# Patient Record
Sex: Female | Born: 1990 | Race: White | Hispanic: No | Marital: Married | Smoking: Former smoker
Health system: Southern US, Community
[De-identification: ages and names within clinical notes are randomized; demographics above are authoritative.]

## PROBLEM LIST (undated history)

## (undated) ENCOUNTER — Inpatient Hospital Stay: Payer: Self-pay

## (undated) DIAGNOSIS — R519 Headache, unspecified: Secondary | ICD-10-CM

## (undated) DIAGNOSIS — G5603 Carpal tunnel syndrome, bilateral upper limbs: Secondary | ICD-10-CM

## (undated) DIAGNOSIS — E781 Pure hyperglyceridemia: Secondary | ICD-10-CM

## (undated) DIAGNOSIS — G43909 Migraine, unspecified, not intractable, without status migrainosus: Secondary | ICD-10-CM

## (undated) DIAGNOSIS — D649 Anemia, unspecified: Secondary | ICD-10-CM

## (undated) DIAGNOSIS — J189 Pneumonia, unspecified organism: Secondary | ICD-10-CM

## (undated) DIAGNOSIS — G932 Benign intracranial hypertension: Secondary | ICD-10-CM

## (undated) DIAGNOSIS — K219 Gastro-esophageal reflux disease without esophagitis: Secondary | ICD-10-CM

## (undated) DIAGNOSIS — O139 Gestational [pregnancy-induced] hypertension without significant proteinuria, unspecified trimester: Secondary | ICD-10-CM

## (undated) DIAGNOSIS — J45909 Unspecified asthma, uncomplicated: Secondary | ICD-10-CM

## (undated) DIAGNOSIS — F4310 Post-traumatic stress disorder, unspecified: Secondary | ICD-10-CM

## (undated) DIAGNOSIS — S83106A Unspecified dislocation of unspecified knee, initial encounter: Secondary | ICD-10-CM

## (undated) DIAGNOSIS — R7303 Prediabetes: Secondary | ICD-10-CM

## (undated) DIAGNOSIS — O24419 Gestational diabetes mellitus in pregnancy, unspecified control: Secondary | ICD-10-CM

## (undated) DIAGNOSIS — M62830 Muscle spasm of back: Secondary | ICD-10-CM

## (undated) DIAGNOSIS — T753XXA Motion sickness, initial encounter: Secondary | ICD-10-CM

## (undated) DIAGNOSIS — Z8489 Family history of other specified conditions: Secondary | ICD-10-CM

## (undated) DIAGNOSIS — Z973 Presence of spectacles and contact lenses: Secondary | ICD-10-CM

## (undated) DIAGNOSIS — Z87442 Personal history of urinary calculi: Secondary | ICD-10-CM

## (undated) DIAGNOSIS — Z9889 Other specified postprocedural states: Secondary | ICD-10-CM

## (undated) DIAGNOSIS — R112 Nausea with vomiting, unspecified: Secondary | ICD-10-CM

## (undated) DIAGNOSIS — R51 Headache: Secondary | ICD-10-CM

## (undated) HISTORY — PX: TUBAL LIGATION: SHX77

## (undated) HISTORY — PX: UTERINE FIBROID EMBOLIZATION: SHX825

## (undated) HISTORY — PX: APPENDECTOMY: SHX54

## (undated) HISTORY — PX: ABDOMINAL HYSTERECTOMY: SHX81

---
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
## 2012-01-29 ENCOUNTER — Ambulatory Visit: Payer: Self-pay

## 2012-06-10 ENCOUNTER — Emergency Department: Payer: Self-pay | Admitting: Emergency Medicine

## 2013-10-01 ENCOUNTER — Emergency Department: Payer: Self-pay | Admitting: Emergency Medicine

## 2013-10-02 LAB — URINALYSIS, COMPLETE
BLOOD: NEGATIVE
Bilirubin,UR: NEGATIVE
GLUCOSE, UR: NEGATIVE mg/dL (ref 0–75)
Ketone: NEGATIVE
LEUKOCYTE ESTERASE: NEGATIVE
Nitrite: NEGATIVE
PH: 5 (ref 4.5–8.0)
RBC,UR: 4 /HPF (ref 0–5)
Specific Gravity: 1.029 (ref 1.003–1.030)
WBC UR: 6 /HPF (ref 0–5)

## 2013-10-02 LAB — COMPREHENSIVE METABOLIC PANEL
ALBUMIN: 4 g/dL (ref 3.4–5.0)
ALK PHOS: 78 U/L
ALT: 58 U/L (ref 12–78)
ANION GAP: 7 (ref 7–16)
BUN: 13 mg/dL (ref 7–18)
Bilirubin,Total: 0.3 mg/dL (ref 0.2–1.0)
CALCIUM: 9.4 mg/dL (ref 8.5–10.1)
CHLORIDE: 103 mmol/L (ref 98–107)
CO2: 26 mmol/L (ref 21–32)
Creatinine: 0.6 mg/dL (ref 0.60–1.30)
EGFR (African American): >60
EGFR (Non-African Amer.): >60
Glucose: 100 mg/dL — ABNORMAL HIGH (ref 65–99)
Osmolality: 272 (ref 275–301)
Potassium: 3.7 mmol/L (ref 3.5–5.1)
SGOT(AST): 29 U/L (ref 15–37)
SODIUM: 136 mmol/L (ref 136–145)
TOTAL PROTEIN: 8.7 g/dL — AB (ref 6.4–8.2)

## 2013-10-02 LAB — CBC WITH DIFFERENTIAL/PLATELET
Basophil #: 0.1 10*3/uL (ref 0.0–0.1)
Basophil %: 0.5 %
EOS PCT: 3.2 %
Eosinophil #: 0.4 10*3/uL (ref 0.0–0.7)
HCT: 39.9 % (ref 35.0–47.0)
HGB: 13.5 g/dL (ref 12.0–16.0)
LYMPHS ABS: 3.8 10*3/uL — AB (ref 1.0–3.6)
LYMPHS PCT: 30.2 %
MCH: 27.6 pg (ref 26.0–34.0)
MCHC: 33.7 g/dL (ref 32.0–36.0)
MCV: 82 fL (ref 80–100)
MONOS PCT: 6.1 %
Monocyte #: 0.8 x10 3/mm (ref 0.2–0.9)
NEUTROS PCT: 60 %
Neutrophil #: 7.4 10*3/uL — ABNORMAL HIGH (ref 1.4–6.5)
Platelet: 271 10*3/uL (ref 150–440)
RBC: 4.88 10*6/uL (ref 3.80–5.20)
RDW: 13.1 % (ref 11.5–14.5)
WBC: 12.4 10*3/uL — ABNORMAL HIGH (ref 3.6–11.0)

## 2013-10-02 LAB — PREGNANCY, URINE: PREGNANCY TEST, URINE: NEGATIVE m[IU]/mL

## 2013-10-02 LAB — LIPASE, BLOOD: Lipase: 98 U/L (ref 73–393)

## 2014-01-06 ENCOUNTER — Ambulatory Visit: Payer: Self-pay | Admitting: Advanced Practice Midwife

## 2014-01-10 ENCOUNTER — Encounter: Payer: Self-pay | Admitting: Maternal & Fetal Medicine

## 2014-01-17 ENCOUNTER — Encounter: Payer: Self-pay | Admitting: Obstetrics & Gynecology

## 2014-02-07 ENCOUNTER — Encounter: Payer: Self-pay | Admitting: Maternal & Fetal Medicine

## 2014-02-07 LAB — T4, FREE: Free Thyroxine: 1.01 ng/dL (ref 0.76–1.46)

## 2014-02-07 LAB — TSH: Thyroid Stimulating Horm: 0.347 u[IU]/mL — ABNORMAL LOW

## 2014-05-03 ENCOUNTER — Ambulatory Visit: Payer: Self-pay | Admitting: Certified Nurse Midwife

## 2014-05-19 ENCOUNTER — Observation Stay: Payer: Self-pay | Admitting: Obstetrics and Gynecology

## 2014-05-24 ENCOUNTER — Ambulatory Visit: Payer: Self-pay | Admitting: Certified Nurse Midwife

## 2014-06-06 ENCOUNTER — Inpatient Hospital Stay: Payer: Self-pay

## 2014-06-06 LAB — PIH PROFILE
AST: 15 U/L (ref 15–37)
Anion Gap: 10 (ref 7–16)
BUN: 9 mg/dL (ref 7–18)
CO2: 20 mmol/L — AB (ref 21–32)
Calcium, Total: 8.2 mg/dL — ABNORMAL LOW (ref 8.5–10.1)
Chloride: 108 mmol/L — ABNORMAL HIGH (ref 98–107)
Creatinine: 0.57 mg/dL — ABNORMAL LOW (ref 0.60–1.30)
EGFR (African American): >60
EGFR (Non-African Amer.): >60
Glucose: 83 mg/dL (ref 65–99)
HCT: 34.1 % — ABNORMAL LOW (ref 35.0–47.0)
HGB: 11.1 g/dL — AB (ref 12.0–16.0)
MCH: 27.1 pg (ref 26.0–34.0)
MCHC: 32.4 g/dL (ref 32.0–36.0)
MCV: 84 fL (ref 80–100)
OSMOLALITY: 274 (ref 275–301)
Platelet: 170 10*3/uL (ref 150–440)
Potassium: 3.8 mmol/L (ref 3.5–5.1)
RBC: 4.08 10*6/uL (ref 3.80–5.20)
RDW: 14.7 % — ABNORMAL HIGH (ref 11.5–14.5)
Sodium: 138 mmol/L (ref 136–145)
Uric Acid: 5.9 mg/dL (ref 2.6–6.0)
WBC: 8.7 10*3/uL (ref 3.6–11.0)

## 2014-06-06 LAB — PROTEIN / CREATININE RATIO, URINE
CREATININE, URINE: 73.6 mg/dL (ref 30.0–125.0)
Protein, Random Urine: 34 mg/dL — ABNORMAL HIGH (ref 0–12)
Protein/Creat. Ratio: 462 mg/gCREAT — ABNORMAL HIGH (ref 0–200)

## 2014-06-06 LAB — CBC WITH DIFFERENTIAL/PLATELET
BASOS ABS: 0 10*3/uL (ref 0.0–0.1)
Basophil %: 0.3 %
Eosinophil #: 0.1 10*3/uL (ref 0.0–0.7)
Eosinophil %: 0.7 %
HCT: 34 % — AB (ref 35.0–47.0)
HGB: 11 g/dL — AB (ref 12.0–16.0)
Lymphocyte #: 1.5 10*3/uL (ref 1.0–3.6)
Lymphocyte %: 16.6 %
MCH: 27.2 pg (ref 26.0–34.0)
MCHC: 32.5 g/dL (ref 32.0–36.0)
MCV: 84 fL (ref 80–100)
Monocyte #: 0.8 x10 3/mm (ref 0.2–0.9)
Monocyte %: 9.1 %
NEUTROS ABS: 6.4 10*3/uL (ref 1.4–6.5)
Neutrophil %: 73.3 %
Platelet: 172 10*3/uL (ref 150–440)
RBC: 4.06 10*6/uL (ref 3.80–5.20)
RDW: 14.9 % — ABNORMAL HIGH (ref 11.5–14.5)
WBC: 8.8 10*3/uL (ref 3.6–11.0)

## 2014-06-06 LAB — LACTATE DEHYDROGENASE: LDH: 164 U/L (ref 81–246)

## 2014-06-09 LAB — HEMATOCRIT: HCT: 34.9 % — AB (ref 35.0–47.0)

## 2015-01-14 NOTE — Consult Note (Signed)
Referral Information:  Reason for Referral 24 yo G2P0010 at 16/4 weeks by ultrasound performed on 01/06/14 (measurements c/w 16 weeks) referred for consultation due to mildly decreased TSH, normal free T4, obesity, and history of tobacco use.   Referring Physician [facility] Department   Past Obstetrical Hx 11/12 4 weeks, [place]   Home Medications: Medication Instructions Status  Prenatal Multivitamins Prenatal Multivitamins oral tablet 1 tab(s) orally once a day Active   Allergies:   Penicillin: Anaphylaxis  Shellfish: Itching  Vital Signs/Notes:  Nursing Vital Signs: **Vital Signs.:   20-Apr-15 08:28  Vital Signs Type Routine  Temperature Temperature (F) 98  Celsius 36.6  Temperature Source oral  Pulse Pulse 88  Respirations Respirations 18  Systolic BP Systolic BP 563  Diastolic BP (mmHg) Diastolic BP (mmHg) 69  Mean BP 85  Pulse Ox % Pulse Ox % 97  Pulse Ox Activity Level  At rest  Oxygen Delivery Room Air/ 21 %   Perinatal Consult:  PMed Hx Hx of varicella   PSurg Hx Appendectomy, age 76   FHx father with cardiomegaly, HTN, no birth defects   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  She has no palpable thyromegaly or nodules.  We addressed the mild decrease in TSH but normal hormone levels. These results may reflect first trimester pregnancy changes.  I recommended repeat full thyroid function testing in one month.   2. BMI 36--we addressed weight gain goals of ~15 lbs during pregnancy.  She reports weight loss during pregnancy and reporst  nausea but no emesis.  She also reports a new aversion to meat but does consume other forms of protein.  We addressed risks for conditions such as preeclampsia.   3. Tobacco use--we addressed risks associated with tobacco use such as IUGR and abruption.   Recommendations 1. Recomemnd repeat full thryoid profile in ~one month 2. Detailed anatomic survey and follwo up growth at ~28-32 weeks.  She plans to resume care with [facility].   Please schedule ultrasound where convenient. 3. She may benefit from nutrition counseling   Plan:  Prenatal Diagnosis Options Level II [place]   Additional Testing Thyroid panel, Folate/prenatal vitamins    Total Time Spent with Patient 30 minutes   >50% of visit spent in couseling/coordination of care yes   Office Use Only [address]9214  Office Visit Level 4 (79min) EST detailed office/outpt   Coding Description: MATERNAL CONDITIONS/HISTORY INDICATION(S).   Obesity - BMI greater than equal to 30.  Electronic Signatures: Manfred Shirts (MD)  (Signed 20-Apr-15 10:57)  Authored: Referral, Home Medications, Allergies, Vital Signs/Notes, Consult, Exam, Lab/Radiology Notes, Impression, Plan, Billing, Coding Description   Last Updated: 20-Apr-15 10:57 by Manfred Shirts (MD)

## 2015-01-31 NOTE — H&P (Signed)
L&D Evaluation:  History:  HPI -CC: q5-46m UCs -HPI: 24 y/o G2P0010 @ 35/0 (16wk u/s), with the above CC. Preg c/b BMI 40, GDMA1, anxiety/depression (on zoloft), h/o tobacco abuse.  pains started this morning  and increased to about q5-65m so she came in for evaluation. no vb, lof or decreased fm.  seen on 8/25 for HROB and was doing well and continued to be diet controlled.   Patient's Surgical History Appendectomy   Medications Pre Natal Vitamins  zoloft   Exam:  Vital Signs AF VS normal and stable   General no apparent distress   Mental Status clear   Chest other  CTAB   Heart RRR, no MRGs   Abdomen gravid, nttp   Estimated Fetal Weight Large for gestational age   Pelvic per rn at cl (?FT)/long/high at approx 1630   Mebranes Intact   FHT 135 baseline +accels, no decels, mod var   Ucx q2-36m   Impression:  Impression eval of labor; pt stable   Plan:  Comments *IUP: category I tracing, fetal status reassuring *EOL: recheck in 1.5hrs and if unchanged, d/c to home with precautions *GDMA1: no issues *GBS unknown: no current RFs *BMI 40: no current issues   Electronic Signatures: Aletha Halim (MD)  (Signed 27-Aug-15 19:08)  Authored: L&D Evaluation   Last Updated: 27-Aug-15 19:08 by Aletha Halim (MD)

## 2015-03-11 ENCOUNTER — Emergency Department
Admission: EM | Admit: 2015-03-11 | Discharge: 2015-03-11 | Disposition: A | Discharge status: Home / Self Care | Payer: Self-pay | Attending: Emergency Medicine | Admitting: Emergency Medicine

## 2015-03-11 ENCOUNTER — Encounter: Payer: Self-pay | Admitting: Emergency Medicine

## 2015-03-11 DIAGNOSIS — Z880 Allergy status to penicillin: Secondary | ICD-10-CM | POA: Insufficient documentation

## 2015-03-11 DIAGNOSIS — J020 Streptococcal pharyngitis: Secondary | ICD-10-CM | POA: Insufficient documentation

## 2015-03-11 DIAGNOSIS — Z79899 Other long term (current) drug therapy: Secondary | ICD-10-CM | POA: Insufficient documentation

## 2015-03-11 DIAGNOSIS — Z720 Tobacco use: Secondary | ICD-10-CM | POA: Insufficient documentation

## 2015-03-11 MED ORDER — AZITHROMYCIN 250 MG PO TABS
500.0000 mg | ORAL_TABLET | Freq: Once | ORAL | Status: AC
  Administered 2015-03-11: 500 mg via ORAL

## 2015-03-11 MED ORDER — ACETAMINOPHEN 325 MG PO TABS
650.0000 mg | ORAL_TABLET | Freq: Four times a day (QID) | ORAL | Status: DC | PRN
  Administered 2015-03-11: 650 mg via ORAL

## 2015-03-11 MED ORDER — ACETAMINOPHEN 325 MG PO TABS
ORAL_TABLET | ORAL | Status: AC
  Filled 2015-03-11: qty 2

## 2015-03-11 MED ORDER — AZITHROMYCIN 500 MG PO TABS: 500.0000 mg | ORAL_TABLET | Freq: Every day | ORAL | Status: AC

## 2015-03-11 MED ORDER — AZITHROMYCIN 250 MG PO TABS
ORAL_TABLET | ORAL | Status: AC
  Administered 2015-03-11: 500 mg via ORAL
  Filled 2015-03-11: qty 2

## 2015-03-11 NOTE — ED Provider Notes (Signed)
[facility] Emergency Department Provider Note  ____________________________________________  Time seen: Approximately 3:20 AM  I have reviewed the triage vital signs and the nursing notes.   HISTORY  Chief Complaint Headache; Fever; and Sore Throat    HPI Julie Estrada is a 23 y.o. female with a history of enlarged tonsils who presents today with 3 days of fever and worsening sore throat. Has nausea but no vomiting. Reports headache. No known sick contacts. Says this was to have tonsils out when 24 years old with did not have follow up. Denies cough.   No past medical history on file.  There are no active problems to display for this patient.   No past surgical history on file.  Current Outpatient Rx  Name  Route  Sig  Dispense  Refill  . Ephedrine-Guaifenesin (BRONKAID PO)   Oral   Take 1 tablet by mouth once.         . Pseudoeph-CPM-DM-APAP (TYLENOL COLD PO)   Oral   Take 1 Dose by mouth every 4 (four) hours as needed (fever/ congestion).         . Pseudoephedrine-Naproxen Na (ALEVE-D SINUS & COLD) 120-220 MG TB12   Oral   Take 1 tablet by mouth once.         . sertraline (ZOLOFT) 50 MG tablet   Oral   Take 50 mg by mouth daily.           Allergies Iodine and Penicillins  No family history on file.  Social History History  Substance Use Topics  . Smoking status: Current Some Day Smoker  . Smokeless tobacco: Never Used  . Alcohol Use: Yes    Review of Systems Constitutional: No fever/chills Eyes: No visual changes. ENT: Plenty of sore throat.  Cardiovascular: Denies chest pain. Respiratory: Denies shortness of breath. Gastrointestinal: No abdominal pain.  No nausea, no vomiting.  No diarrhea.  No constipation. Genitourinary: Negative for dysuria. Musculoskeletal: Negative for back pain. Skin: Negative for rash. Neurological: Negative for headaches, focal weakness or numbness.  10-point ROS otherwise  negative.  ____________________________________________   PHYSICAL EXAM:  VITAL SIGNS: ED Triage Vitals  Enc Vitals Group     BP 03/11/15 0138 135/87 mmHg     Pulse Rate 03/11/15 0138 127     Resp 03/11/15 0138 18     Temp 03/11/15 0138 101.8 F (38.8 C)     Temp Source 03/11/15 0138 Oral     SpO2 03/11/15 0138 97 %     Weight 03/11/15 0138 220 lb (99.791 kg)     Height 03/11/15 0138 5\' 3"  (1.6 m)     Head Cir --      Peak Flow --      Pain Score 03/11/15 0139 10     Pain Loc --      Pain Edu? --      Excl. in [place]? --     Constitutional: Alert and oriented. Well appearing and in no acute distress. Eyes: Conjunctivae are normal. PERRL. EOMI. Head: Atraumatic. Nose: No congestion/rhinnorhea. Mouth/Throat: Mucous membranes are moist.  Beefy-red pharynx with swollen tonsils with exudate. Tonsils are symmetrically swollen and are not touching. Neck: No stridor.  Tender swollen anterior lymphadenopathy. Cardiovascular: Normal rate, regular rhythm. Grossly normal heart sounds.  Good peripheral circulation. Respiratory: Normal respiratory effort.  No retractions. Lungs CTAB. Controlling secretions and with normal voice. Gastrointestinal: Soft and nontender. No distention. No abdominal bruits. No CVA tenderness. Musculoskeletal: No lower extremity tenderness  nor edema.  No joint effusions. Neurologic:  Normal speech and language. No gross focal neurologic deficits are appreciated. Speech is normal. No gait instability. Skin:  Skin is warm, dry and intact. No rash noted. Psychiatric: Mood and affect are normal. Speech and behavior are normal.  ____________________________________________   LABS (all labs ordered are listed, but only abnormal results are displayed)  Labs Reviewed - No data to  display ____________________________________________  EKG   ____________________________________________  RADIOLOGY   ____________________________________________   PROCEDURES    ____________________________________________   INITIAL IMPRESSION / ASSESSMENT AND PLAN / ED COURSE  Pertinent labs & imaging results that were available during my care of the patient were reviewed by me and considered in my medical decision making (see chart for details).  ----------------------------------------- 3:23 AM on 03/11/2015 -----------------------------------------  Weight 102 at this time with temperature of 98.4. We'll treat patient for strep pharyngitis. Centor score of 4.  Penicillin allergy we'll treat with azithromycin. Patient will continue with Tylenol and ibuprofen at home for fever and pain.  ____________________________________________   FINAL CLINICAL IMPRESSION(S) / ED DIAGNOSES  Acute strep pharyngitis. Initial visit.    Orbie Pyo, MD 03/11/15 [phone]

## 2015-03-11 NOTE — Discharge Instructions (Signed)
Pharyngitis Pharyngitis is a sore throat (pharynx). There is redness, pain, and swelling of your throat. HOME CARE   Drink enough fluids to keep your pee (urine) clear or pale yellow.  Only take medicine as told by your doctor.  You may get sick again if you do not take medicine as told. Finish your medicines, even if you start to feel better.  Do not take aspirin.  Rest.  Rinse your mouth (gargle) with salt water ( tsp of salt per 1 qt of water) every 1-2 hours. This will help the pain.  If you are not at risk for choking, you can suck on hard candy or sore throat lozenges. GET HELP IF:  You have large, tender lumps on your neck.  You have a rash.  You cough up green, yellow-brown, or bloody spit. GET HELP RIGHT AWAY IF:   You have a stiff neck.  You drool or cannot swallow liquids.  You throw up (vomit) or are not able to keep medicine or liquids down.  You have very bad pain that does not go away with medicine.  You have problems breathing (not from a stuffy nose). MAKE SURE YOU:   Understand these instructions.  Will watch your condition.  Will get help right away if you are not doing well or get worse. Document Released: 02/26/2008 Document Revised: 06/30/2013 Document Reviewed: 05/17/2013 [facility] Patient Information 2015 [place], [place]. This information is not intended to replace advice given to you by your health care provider. Make sure you discuss any questions you have with your health care provider.  Strep Throat Strep throat is an infection of the throat. It is caused by a germ. Strep throat spreads from person to person by coughing, sneezing, or close contact. HOME CARE  Rinse your mouth (gargle) with warm salt water (1 teaspoon salt in 1 cup of water). Do this 3 to 4 times per day or as needed for comfort.  Family members with a sore throat or fever should see a doctor.  Make sure everyone in your house washes their hands well.  Do not share  food, drinking cups, or personal items.  Eat soft foods until your sore throat gets better.  Drink enough water and fluids to keep your pee (urine) clear or pale yellow.  Rest.  Stay home from school, daycare, or work until you have taken medicine for 24 hours.  Only take medicine as told by your doctor.  Take your medicine as told. Finish it even if you start to feel better. GET HELP RIGHT AWAY IF:   You have new problems, such as throwing up (vomiting) or bad headaches.  You have a stiff or painful neck, chest pain, trouble breathing, or trouble swallowing.  You have very bad throat pain, drooling, or changes in your voice.  Your neck puffs up (swells) or gets red and tender.  You have a fever.  You are very tired, your mouth is dry, or you are peeing less than normal.  You cannot wake up completely.  You get a rash, cough, or earache.  You have green, yellow-brown, or bloody spit.  Your pain does not get better with medicine. MAKE SURE YOU:   Understand these instructions.  Will watch your condition.  Will get help right away if you are not doing well or get worse. Document Released: 02/26/2008 Document Revised: 12/02/2011 Document Reviewed: 11/08/2010 [facility] Patient Information 2015 [place], [place]. This information is not intended to replace advice given to you by  your health care provider. Make sure you discuss any questions you have with your health care provider. ° °

## 2015-03-11 NOTE — ED Notes (Addendum)
Pt reports headache, sore throat and fever since Thursday afternoon.  Pt reports taking tylenol cold, aleve D, bronchaid w/o relief.  Pt tachycardic in triage.  Pt NAD at this time.    POC Strep positive in triage

## 2015-03-11 NOTE — ED Notes (Addendum)
Positive Rapid Strep throat culture

## 2015-03-15 LAB — POCT RAPID STREP A: STREPTOCOCCUS, GROUP A SCREEN (DIRECT): POSITIVE — AB

## 2015-10-09 ENCOUNTER — Ambulatory Visit (INDEPENDENT_AMBULATORY_CARE_PROVIDER_SITE_OTHER): Payer: BLUE CROSS/BLUE SHIELD | Admitting: Licensed Clinical Social Worker

## 2015-10-09 DIAGNOSIS — F39 Unspecified mood [affective] disorder: Secondary | ICD-10-CM
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
## 2015-10-27 DIAGNOSIS — F411 Generalized anxiety disorder: Secondary | ICD-10-CM | POA: Insufficient documentation

## 2015-10-27 DIAGNOSIS — F4310 Post-traumatic stress disorder, unspecified: Secondary | ICD-10-CM | POA: Insufficient documentation

## 2015-11-22 DIAGNOSIS — M67431 Ganglion, right wrist: Secondary | ICD-10-CM

## 2015-11-22 HISTORY — DX: Ganglion, right wrist: M67.431

## 2015-11-27 ENCOUNTER — Encounter: Payer: Self-pay | Admitting: *Deleted

## 2015-12-06 ENCOUNTER — Ambulatory Visit: Payer: BLUE CROSS/BLUE SHIELD | Admitting: Anesthesiology

## 2015-12-06 ENCOUNTER — Encounter
Admission: RE | Disposition: A | Discharge status: Home / Self Care | Payer: Self-pay | Source: Ambulatory Visit | Attending: Surgery

## 2015-12-06 ENCOUNTER — Ambulatory Visit
Admission: RE | Admit: 2015-12-06 | Discharge: 2015-12-06 | Disposition: A | Discharge status: Home / Self Care | Payer: BLUE CROSS/BLUE SHIELD | Source: Ambulatory Visit | Attending: Surgery | Admitting: Surgery

## 2015-12-06 DIAGNOSIS — Z791 Long term (current) use of non-steroidal anti-inflammatories (NSAID): Secondary | ICD-10-CM | POA: Diagnosis not present

## 2015-12-06 DIAGNOSIS — Z833 Family history of diabetes mellitus: Secondary | ICD-10-CM | POA: Diagnosis not present

## 2015-12-06 DIAGNOSIS — F411 Generalized anxiety disorder: Secondary | ICD-10-CM | POA: Insufficient documentation

## 2015-12-06 DIAGNOSIS — Z880 Allergy status to penicillin: Secondary | ICD-10-CM | POA: Insufficient documentation

## 2015-12-06 DIAGNOSIS — Z8249 Family history of ischemic heart disease and other diseases of the circulatory system: Secondary | ICD-10-CM | POA: Diagnosis not present

## 2015-12-06 DIAGNOSIS — Z9049 Acquired absence of other specified parts of digestive tract: Secondary | ICD-10-CM | POA: Diagnosis not present

## 2015-12-06 DIAGNOSIS — F329 Major depressive disorder, single episode, unspecified: Secondary | ICD-10-CM | POA: Diagnosis not present

## 2015-12-06 DIAGNOSIS — K219 Gastro-esophageal reflux disease without esophagitis: Secondary | ICD-10-CM | POA: Insufficient documentation

## 2015-12-06 DIAGNOSIS — Z79899 Other long term (current) drug therapy: Secondary | ICD-10-CM | POA: Diagnosis not present

## 2015-12-06 DIAGNOSIS — Z91048 Other nonmedicinal substance allergy status: Secondary | ICD-10-CM | POA: Insufficient documentation

## 2015-12-06 DIAGNOSIS — M67431 Ganglion, right wrist: Secondary | ICD-10-CM | POA: Diagnosis not present

## 2015-12-06 DIAGNOSIS — Z8349 Family history of other endocrine, nutritional and metabolic diseases: Secondary | ICD-10-CM | POA: Diagnosis not present

## 2015-12-06 DIAGNOSIS — Z806 Family history of leukemia: Secondary | ICD-10-CM | POA: Diagnosis not present

## 2015-12-06 DIAGNOSIS — Z801 Family history of malignant neoplasm of trachea, bronchus and lung: Secondary | ICD-10-CM | POA: Insufficient documentation

## 2015-12-06 HISTORY — DX: Presence of spectacles and contact lenses: Z97.3

## 2015-12-06 HISTORY — DX: Headache: R51

## 2015-12-06 HISTORY — DX: Unspecified dislocation of unspecified knee, initial encounter: S83.106A

## 2015-12-06 HISTORY — DX: Gestational diabetes mellitus in pregnancy, unspecified control: O24.419

## 2015-12-06 HISTORY — DX: Headache, unspecified: R51.9

## 2015-12-06 HISTORY — DX: Muscle spasm of back: M62.830

## 2015-12-06 HISTORY — DX: Motion sickness, initial encounter: T75.3XXA

## 2015-12-06 HISTORY — PX: GANGLION CYST EXCISION: SHX1691

## 2015-12-06 HISTORY — DX: Anemia, unspecified: D64.9

## 2015-12-06 SURGERY — EXCISION, GANGLION CYST, WRIST
Anesthesia: General | Site: Hand | Laterality: Right | Wound class: Clean

## 2015-12-06 MED ORDER — ONDANSETRON HCL 4 MG/2ML IJ SOLN: 4.0000 mg | Freq: Once | INTRAMUSCULAR | Status: DC | PRN

## 2015-12-06 MED ORDER — MIDAZOLAM HCL 5 MG/5ML IJ SOLN
INTRAMUSCULAR | Status: DC | PRN
  Administered 2015-12-06: 2 mg via INTRAVENOUS

## 2015-12-06 MED ORDER — OXYCODONE HCL 5 MG PO TABS
5.0000 mg | ORAL_TABLET | Freq: Once | ORAL | Status: AC
  Administered 2015-12-06: 5 mg via ORAL

## 2015-12-06 MED ORDER — SCOPOLAMINE 1 MG/3DAYS TD PT72
1.0000 | MEDICATED_PATCH | Freq: Once | TRANSDERMAL | Status: DC
  Administered 2015-12-06: 1.5 mg via TRANSDERMAL

## 2015-12-06 MED ORDER — CLINDAMYCIN PHOSPHATE 900 MG/50ML IV SOLN
900.0000 mg | Freq: Once | INTRAVENOUS | Status: AC
  Administered 2015-12-06: 900 mg via INTRAVENOUS

## 2015-12-06 MED ORDER — METOCLOPRAMIDE HCL 5 MG/ML IJ SOLN: 5.0000 mg | Freq: Three times a day (TID) | INTRAMUSCULAR | Status: DC | PRN

## 2015-12-06 MED ORDER — LACTATED RINGERS IV SOLN
INTRAVENOUS | Status: DC
  Administered 2015-12-06: 13:00:00 via INTRAVENOUS

## 2015-12-06 MED ORDER — HYDROCODONE-ACETAMINOPHEN 5-325 MG PO TABS: 1.0000 | ORAL_TABLET | ORAL | Status: DC | PRN

## 2015-12-06 MED ORDER — GLYCOPYRROLATE 0.2 MG/ML IJ SOLN
INTRAMUSCULAR | Status: DC | PRN
  Administered 2015-12-06: 0.1 mg via INTRAVENOUS

## 2015-12-06 MED ORDER — DEXAMETHASONE SODIUM PHOSPHATE 4 MG/ML IJ SOLN
INTRAMUSCULAR | Status: DC | PRN
  Administered 2015-12-06: 4 mg via INTRAVENOUS

## 2015-12-06 MED ORDER — LIDOCAINE HCL (CARDIAC) 20 MG/ML IV SOLN
INTRAVENOUS | Status: DC | PRN
  Administered 2015-12-06: 40 mg via INTRATRACHEAL

## 2015-12-06 MED ORDER — METOCLOPRAMIDE HCL 5 MG PO TABS: 5.0000 mg | ORAL_TABLET | Freq: Three times a day (TID) | ORAL | Status: DC | PRN

## 2015-12-06 MED ORDER — FENTANYL CITRATE (PF) 100 MCG/2ML IJ SOLN: 25.0000 ug | INTRAMUSCULAR | Status: DC | PRN

## 2015-12-06 MED ORDER — ONDANSETRON HCL 4 MG/2ML IJ SOLN: 4.0000 mg | Freq: Four times a day (QID) | INTRAMUSCULAR | Status: DC | PRN

## 2015-12-06 MED ORDER — POTASSIUM CHLORIDE IN NACL 20-0.9 MEQ/L-% IV SOLN: INTRAVENOUS | Status: DC

## 2015-12-06 MED ORDER — HYDROCODONE-ACETAMINOPHEN 5-325 MG PO TABS: 1.0000 | ORAL_TABLET | Freq: Four times a day (QID) | ORAL | Status: DC | PRN

## 2015-12-06 MED ORDER — ONDANSETRON HCL 4 MG PO TABS: 4.0000 mg | ORAL_TABLET | Freq: Four times a day (QID) | ORAL | Status: DC | PRN

## 2015-12-06 MED ORDER — BUPIVACAINE HCL (PF) 0.5 % IJ SOLN
INTRAMUSCULAR | Status: DC | PRN
  Administered 2015-12-06: 5 mL

## 2015-12-06 MED ORDER — ONDANSETRON HCL 4 MG/2ML IJ SOLN
INTRAMUSCULAR | Status: DC | PRN
  Administered 2015-12-06: 4 mg via INTRAVENOUS

## 2015-12-06 MED ORDER — PROPOFOL 10 MG/ML IV BOLUS
INTRAVENOUS | Status: DC | PRN
  Administered 2015-12-06: 150 mg via INTRAVENOUS

## 2015-12-06 MED ORDER — FENTANYL CITRATE (PF) 100 MCG/2ML IJ SOLN
INTRAMUSCULAR | Status: DC | PRN
  Administered 2015-12-06 (×2): 25 ug via INTRAVENOUS
  Administered 2015-12-06: 50 ug via INTRAVENOUS

## 2015-12-06 SURGICAL SUPPLY — 27 items
BANDAGE ELASTIC 2 LF NS (GAUZE/BANDAGES/DRESSINGS) ×4 IMPLANT
BNDG COHESIVE 4X5 TAN STRL (GAUZE/BANDAGES/DRESSINGS) ×2 IMPLANT
BNDG ESMARK 4X12 TAN STRL LF (GAUZE/BANDAGES/DRESSINGS) ×2 IMPLANT
CHLORAPREP W/TINT 26ML (MISCELLANEOUS) ×2 IMPLANT
CORD BIP STRL DISP 12FT (MISCELLANEOUS) ×2 IMPLANT
COVER LIGHT HANDLE UNIVERSAL (MISCELLANEOUS) ×4 IMPLANT
CUFF TOURN SGL QUICK 18 (TOURNIQUET CUFF) ×2 IMPLANT
GAUZE PETRO XEROFOAM 1X8 (MISCELLANEOUS) ×2 IMPLANT
GAUZE SPONGE 4X4 12PLY STRL (GAUZE/BANDAGES/DRESSINGS) ×2 IMPLANT
GLOVE BIO SURGEON STRL SZ8 (GLOVE) ×4 IMPLANT
GLOVE BIOGEL PI IND STRL 8 (GLOVE) ×1 IMPLANT
GLOVE BIOGEL PI INDICATOR 8 (GLOVE) ×1
GLOVE INDICATOR 8.0 STRL GRN (GLOVE) ×2 IMPLANT
GOWN STRL REUS W/ TWL LRG LVL3 (GOWN DISPOSABLE) ×1 IMPLANT
GOWN STRL REUS W/ TWL XL LVL3 (GOWN DISPOSABLE) ×1 IMPLANT
GOWN STRL REUS W/TWL LRG LVL3 (GOWN DISPOSABLE) ×1
GOWN STRL REUS W/TWL XL LVL3 (GOWN DISPOSABLE) ×1
KIT ROOM TURNOVER OR (KITS) ×2 IMPLANT
NS IRRIG 500ML POUR BTL (IV SOLUTION) ×2 IMPLANT
PACK EXTREMITY ARMC (MISCELLANEOUS) ×2 IMPLANT
PAD GROUND ADULT SPLIT (MISCELLANEOUS) IMPLANT
STOCKINETTE IMPERVIOUS 9X36 MD (GAUZE/BANDAGES/DRESSINGS) ×2 IMPLANT
STRAP BODY AND KNEE 60X3 (MISCELLANEOUS) ×2 IMPLANT
STRIP CLOSURE SKIN 1/4X4 (GAUZE/BANDAGES/DRESSINGS) ×2 IMPLANT
SUT VIC AB 3-0 SH 27 (SUTURE) ×1
SUT VIC AB 3-0 SH 27X BRD (SUTURE) ×1 IMPLANT
SWABSTK COMLB BENZOIN TINCTURE (MISCELLANEOUS) ×2 IMPLANT

## 2015-12-06 NOTE — H&P (Signed)
Paper H&P to be scanned into permanent record. H&P reviewed. No changes. 

## 2015-12-06 NOTE — Discharge Instructions (Signed)
General Anesthesia, Adult, Care After °Refer to this sheet in the next few weeks. These instructions provide you with information on caring for yourself after your procedure. Your health care provider may also give you more specific instructions. Your treatment has been planned according to current medical practices, but problems sometimes occur. Call your health care provider if you have any problems or questions after your procedure. °WHAT TO EXPECT AFTER THE PROCEDURE °After the procedure, it is typical to experience: °· Sleepiness. °· Nausea and vomiting. °HOME CARE INSTRUCTIONS °· For the first 24 hours after general anesthesia: °¨ Have a responsible person with you. °¨ Do not drive a car. If you are alone, do not take public transportation. °¨ Do not drink alcohol. °¨ Do not take medicine that has not been prescribed by your health care provider. °¨ Do not sign important papers or make important decisions. °¨ You may resume a normal diet and activities as directed by your health care provider. °· Change bandages (dressings) as directed. °· If you have questions or problems that seem related to general anesthesia, call the hospital and ask for the anesthetist or anesthesiologist on call. °SEEK MEDICAL CARE IF: °· You have nausea and vomiting that continue the day after anesthesia. °· You develop a rash. °SEEK IMMEDIATE MEDICAL CARE IF:  °· You have difficulty breathing. °· You have chest pain. °· You have any allergic problems. °  °This information is not intended to replace advice given to you by your health care provider. Make sure you discuss any questions you have with your health care provider. °  °Document Released: 12/16/2000 Document Revised: 09/30/2014 Document Reviewed: 01/08/2012 °Elsevier Interactive Patient Education ©2016 Elsevier Inc. ° °Keep dressing dry and intact. °Keep hand elevated above heart level. °May shower after dressing removed on postop day 4 (Sunday). °Cover sutures with Band-Aids  after drying off. °Apply ice to affected area frequently. °Return for follow-up in 10-14 days or as scheduled. °

## 2015-12-06 NOTE — Anesthesia Postprocedure Evaluation (Signed)
Anesthesia Post Note  Patient: Julie Estrada  Procedure(s) Performed: Procedure(s) (LRB): REMOVAL GANGLION OF WRIST (Right)  Patient location during evaluation: PACU Anesthesia Type: General Level of consciousness: awake and alert and oriented Pain management: satisfactory to patient Vital Signs Assessment: post-procedure vital signs reviewed and stable Respiratory status: spontaneous breathing, nonlabored ventilation and respiratory function stable Cardiovascular status: blood pressure returned to baseline and stable Postop Assessment: Adequate PO intake and No signs of nausea or vomiting Anesthetic complications: no    Raliegh Ip

## 2015-12-06 NOTE — Transfer of Care (Signed)
Immediate Anesthesia Transfer of Care Note  Patient: Julie Estrada  Procedure(s) Performed: Procedure(s): REMOVAL GANGLION OF WRIST (Right)  Patient Location: PACU  Anesthesia Type: General LMA  Level of Consciousness: awake, alert  and patient cooperative  Airway and Oxygen Therapy: Patient Spontanous Breathing and Patient connected to supplemental oxygen  Post-op Assessment: Post-op Vital signs reviewed, Patient's Cardiovascular Status Stable, Respiratory Function Stable, Patent Airway and No signs of Nausea or vomiting  Post-op Vital Signs: Reviewed and stable  Complications: No apparent anesthesia complications

## 2015-12-06 NOTE — Anesthesia Procedure Notes (Signed)
Procedure Name: LMA Insertion Date/Time: 12/06/2015 1:47 PM Performed by: Mayme Genta Pre-anesthesia Checklist: Patient identified, Emergency Drugs available, Suction available, Timeout performed and Patient being monitored Patient Re-evaluated:Patient Re-evaluated prior to inductionOxygen Delivery Method: Circle system utilized Preoxygenation: Pre-oxygenation with 100% oxygen Intubation Type: IV induction LMA: LMA inserted LMA Size: 4.0 Number of attempts: 1 Placement Confirmation: positive ETCO2 and breath sounds checked- equal and bilateral Tube secured with: Tape

## 2015-12-06 NOTE — Anesthesia Preprocedure Evaluation (Signed)
Anesthesia Evaluation  Patient identified by MRN, date of birth, ID band  Reviewed: Allergy & Precautions, H&P , NPO status , Patient's Chart, lab work & pertinent test results  Airway Mallampati: I  TM Distance: >3 FB Neck ROM: full    Dental no notable dental hx.    Pulmonary former smoker,    Pulmonary exam normal        Cardiovascular  Rhythm:regular Rate:Normal     Neuro/Psych    GI/Hepatic   Endo/Other  Morbid obesity  Renal/GU      Musculoskeletal   Abdominal   Peds  Hematology   Anesthesia Other Findings   Reproductive/Obstetrics                             Anesthesia Physical Anesthesia Plan  ASA: II  Anesthesia Plan: General LMA   Post-op Pain Management:    Induction:   Airway Management Planned:   Additional Equipment:   Intra-op Plan:   Post-operative Plan:   Informed Consent: I have reviewed the patients History and Physical, chart, labs and discussed the procedure including the risks, benefits and alternatives for the proposed anesthesia with the patient or authorized representative who has indicated his/her understanding and acceptance.     Plan Discussed with: CRNA  Anesthesia Plan Comments:         Anesthesia Quick Evaluation

## 2015-12-06 NOTE — Op Note (Signed)
12/06/2015  2:18 PM  Patient:   Julie Estrada  Pre-Op Diagnosis:   Right dorsal carpal ganglion.  Post-Op Diagnosis:   Ganglion cyst of extensor tendon sheath, right dorsal wrist.  Procedure:   Excision of extensor tendon sheath ganglion cyst, right dorsal wrist.  Surgeon:   Pascal Lux, MD  Anesthesia:   General LMA  Findings:   As above.  Complications:   None  EBL:   <1 cc  Fluids:   450 cc crystalloid  TT:   14 minutes at 250 mmHg  Drains:   None  Closure:   3-0 Vicryl subcuticular sutures  Brief Clinical Note:   The patient is a 25 year old female with a several month history of a painful soft tissue mass on the dorsal aspect of her right wrist. Her history and examination were suspicious for a right dorsal carpal ganglion. The patient presents at this time for excision of the right dorsal carpal ganglion.  Procedure:   The patient was brought into the operating room and lain in the supine position. After adequate general laryngal mask anesthesia was obtained, the right hand and upper extremity were prepped with ChloraPrep solution before being draped sterilely. Preoperative antibiotics were administered. A timeout was performed to verify the appropriate surgical site before the right upper extremity was exsanguinated with an Esmarch and the tourniquet inflated to 250 mmHg. An approximate 1.5 cm incision was made transversely over the cyst in line with the skin creases. The incision was carried down through the subcutaneous tissues with care taken to identify and protect any neurovascular structures. A portion of the extensor retinaculum was incised to permit further access to the cyst. The cyst was noted to be attached to one of the extensor digitorum communist tendons and emanating from the peri-tenderness tissues. The cyst was carefully dissected away from the tendon and removed in its entirety. Some redundant tenosynovium was removed as well.  The wound was  copiously irrigated with sterile saline solution before the skin was closed using 3-0 Vicryl subcuticular sutures. Benzoin and Steri-Strips were applied to the skin before a sterile bulky dressing was applied to the wrist and hand. The patient was then awakened, extubated, and returned to the recovery room in satisfactory condition after tolerating the procedure well.

## 2015-12-07 ENCOUNTER — Encounter: Payer: Self-pay | Admitting: Surgery

## 2015-12-07 ENCOUNTER — Ambulatory Visit: Payer: BLUE CROSS/BLUE SHIELD | Admitting: Psychiatry

## 2015-12-08 LAB — SURGICAL PATHOLOGY

## 2015-12-26 DIAGNOSIS — E669 Obesity, unspecified: Secondary | ICD-10-CM | POA: Insufficient documentation

## 2016-01-01 ENCOUNTER — Ambulatory Visit (INDEPENDENT_AMBULATORY_CARE_PROVIDER_SITE_OTHER): Payer: Self-pay | Admitting: Psychiatry

## 2016-01-12 ENCOUNTER — Emergency Department
Admission: EM | Admit: 2016-01-12 | Discharge: 2016-01-12 | Disposition: A | Discharge status: Home / Self Care | Payer: BLUE CROSS/BLUE SHIELD | Attending: Emergency Medicine | Admitting: Emergency Medicine

## 2016-01-12 ENCOUNTER — Emergency Department: Payer: BLUE CROSS/BLUE SHIELD

## 2016-01-12 DIAGNOSIS — Z87891 Personal history of nicotine dependence: Secondary | ICD-10-CM | POA: Insufficient documentation

## 2016-01-12 DIAGNOSIS — H81391 Other peripheral vertigo, right ear: Secondary | ICD-10-CM

## 2016-01-12 DIAGNOSIS — R51 Headache: Secondary | ICD-10-CM | POA: Insufficient documentation

## 2016-01-12 DIAGNOSIS — R519 Headache, unspecified: Secondary | ICD-10-CM

## 2016-01-12 DIAGNOSIS — F419 Anxiety disorder, unspecified: Secondary | ICD-10-CM | POA: Insufficient documentation

## 2016-01-12 DIAGNOSIS — R0789 Other chest pain: Secondary | ICD-10-CM | POA: Diagnosis present

## 2016-01-12 HISTORY — DX: Post-traumatic stress disorder, unspecified: F43.10

## 2016-01-12 LAB — BASIC METABOLIC PANEL
Anion gap: 8 (ref 5–15)
BUN: 14 mg/dL (ref 6–20)
CALCIUM: 9.2 mg/dL (ref 8.9–10.3)
CO2: 22 mmol/L (ref 22–32)
CREATININE: 0.54 mg/dL (ref 0.44–1.00)
Chloride: 106 mmol/L (ref 101–111)
GFR calc non Af Amer: >60 mL/min (ref >60)
Glucose, Bld: 96 mg/dL (ref 65–99)
Potassium: 3.7 mmol/L (ref 3.5–5.1)
SODIUM: 136 mmol/L (ref 135–145)

## 2016-01-12 LAB — CBC
HCT: 42 % (ref 35.0–47.0)
Hemoglobin: 13.7 g/dL (ref 12.0–16.0)
MCH: 26.9 pg (ref 26.0–34.0)
MCHC: 32.7 g/dL (ref 32.0–36.0)
MCV: 82.4 fL (ref 80.0–100.0)
PLATELETS: 224 10*3/uL (ref 150–440)
RBC: 5.1 MIL/uL (ref 3.80–5.20)
RDW: 13.5 % (ref 11.5–14.5)
WBC: 8.2 10*3/uL (ref 3.6–11.0)

## 2016-01-12 LAB — TROPONIN I

## 2016-01-12 MED ORDER — MECLIZINE HCL 25 MG PO TABS: 25.0000 mg | ORAL_TABLET | Freq: Three times a day (TID) | ORAL | Status: DC | PRN

## 2016-01-12 MED ORDER — PREDNISONE 20 MG PO TABS: 40.0000 mg | ORAL_TABLET | Freq: Every day | ORAL | Status: DC

## 2016-01-12 MED ORDER — METOCLOPRAMIDE HCL 10 MG PO TABS: 10.0000 mg | ORAL_TABLET | Freq: Three times a day (TID) | ORAL | Status: DC

## 2016-01-12 NOTE — Discharge Instructions (Signed)
Benign Positional Vertigo °Vertigo is the feeling that you or your surroundings are moving when they are not. Benign positional vertigo is the most common form of vertigo. The cause of this condition is not serious (is benign). This condition is triggered by certain movements and positions (is positional). This condition can be dangerous if it occurs while you are doing something that could endanger you or others, such as driving.  °CAUSES °In many cases, the cause of this condition is not known. It may be caused by a disturbance in an area of the inner ear that helps your brain to sense movement and balance. This disturbance can be caused by a viral infection (labyrinthitis), head injury, or repetitive motion. °RISK FACTORS °This condition is more likely to develop in: °· Women. °· People who are 50 years of age or older. °SYMPTOMS °Symptoms of this condition usually happen when you move your head or your eyes in different directions. Symptoms may start suddenly, and they usually last for less than a minute. Symptoms may include: °· Loss of balance and falling. °· Feeling like you are spinning or moving. °· Feeling like your surroundings are spinning or moving. °· Nausea and vomiting. °· Blurred vision. °· Dizziness. °· Involuntary eye movement (nystagmus). °Symptoms can be mild and cause only slight annoyance, or they can be severe and interfere with daily life. Episodes of benign positional vertigo may return (recur) over time, and they may be triggered by certain movements. Symptoms may improve over time. °DIAGNOSIS °This condition is usually diagnosed by medical history and a physical exam of the head, neck, and ears. You may be referred to a health care provider who specializes in ear, nose, and throat (ENT) problems (otolaryngologist) or a provider who specializes in disorders of the nervous system (neurologist). You may have additional testing, including: °· MRI. °· A CT scan. °· Eye movement tests. Your  health care provider may ask you to change positions quickly while he or she watches you for symptoms of benign positional vertigo, such as nystagmus. Eye movement may be tested with an electronystagmogram (ENG), caloric stimulation, the Dix-Hallpike test, or the roll test. °· An electroencephalogram (EEG). This records electrical activity in your brain. °· Hearing tests. °TREATMENT °Usually, your health care provider will treat this by moving your head in specific positions to adjust your inner ear back to normal. Surgery may be needed in severe cases, but this is rare. In some cases, benign positional vertigo may resolve on its own in 2-4 weeks. °HOME CARE INSTRUCTIONS °Safety °· Move slowly. Avoid sudden body or head movements. °· Avoid driving. °· Avoid operating heavy machinery. °· Avoid doing any tasks that would be dangerous to you or others if a vertigo episode would occur. °· If you have trouble walking or keeping your balance, try using a cane for stability. If you feel dizzy or unstable, sit down right away. °· Return to your normal activities as told by your health care provider. Ask your health care provider what activities are safe for you. °General Instructions °· Take over-the-counter and prescription medicines only as told by your health care provider. °· Avoid certain positions or movements as told by your health care provider. °· Drink enough fluid to keep your urine clear or pale yellow. °· Keep all follow-up visits as told by your health care provider. This is important. °SEEK MEDICAL CARE IF: °· You have a fever. °· Your condition gets worse or you develop new symptoms. °· Your family or friends   notice any behavioral changes.  Your nausea or vomiting gets worse.  You have numbness or a "pins and needles" sensation. SEEK IMMEDIATE MEDICAL CARE IF:  You have difficulty speaking or moving.  You are always dizzy.  You faint.  You develop severe headaches.  You have weakness in your  legs or arms.  You have changes in your hearing or vision.  You develop a stiff neck.  You develop sensitivity to light.   This information is not intended to replace advice given to you by your health care provider. Make sure you discuss any questions you have with your health care provider.   Document Released: 06/17/2006 Document Revised: 05/31/2015 Document Reviewed: 01/02/2015 Elsevier Interactive Patient Education 2016 [employer].  [employer] Self-Care WHAT IS THE EPLEY MANEUVER? The Epley maneuver is an exercise you can do to relieve symptoms of benign paroxysmal positional vertigo (BPPV). This condition is often just referred to as vertigo. BPPV is caused by the movement of tiny crystals (canaliths) inside your inner ear. The accumulation and movement of canaliths in your inner ear causes a sudden spinning sensation (vertigo) when you move your head to certain positions. Vertigo usually lasts about 30 seconds. BPPV usually occurs in just one ear. If you get vertigo when you lie on your left side, you probably have BPPV in your left ear. Your health care provider can tell you which ear is involved.  BPPV may be caused by a head injury. Many people older than 50 get BPPV for unknown reasons. If you have been diagnosed with BPPV, your health care provider may teach you how to do this maneuver. BPPV is not life threatening (benign) and usually goes away in time.  WHEN SHOULD I PERFORM THE EPLEY MANEUVER? You can do this maneuver at home whenever you have symptoms of vertigo. You may do the Epley maneuver up to 3 times a day until your symptoms of vertigo go away. HOW SHOULD I DO THE EPLEY MANEUVER?  Sit on the edge of a bed or table with your back straight. Your legs should be extended or hanging over the edge of the bed or table.   Turn your head halfway toward the affected ear.   Lie backward quickly with your head turned until you are lying flat on your back. You may want to  position a pillow under your shoulders.   Hold this position for 30 seconds. You may experience an attack of vertigo. This is normal. Hold this position until the vertigo stops.  Then turn your head to the opposite direction until your unaffected ear is facing the floor.   Hold this position for 30 seconds. You may experience an attack of vertigo. This is normal. Hold this position until the vertigo stops.  Now turn your whole body to the same side as your head. Hold for another 30 seconds.   You can then sit back up. ARE THERE RISKS TO THIS MANEUVER? In some cases, you may have other symptoms (such as changes in your vision, weakness, or numbness). If you have these symptoms, stop doing the maneuver and call your health care provider. Even if doing these maneuvers relieves your vertigo, you may still have dizziness. Dizziness is the sensation of light-headedness but without the sensation of movement. Even though the Epley maneuver may relieve your vertigo, it is possible that your symptoms will return within 5 years. WHAT SHOULD I DO AFTER THIS MANEUVER? After doing the Epley maneuver, you can return to your  normal activities. Ask your doctor if there is anything you should do at home to prevent vertigo. This may include:  Sleeping with two or more pillows to keep your head elevated.  Not sleeping on the side of your affected ear.  Getting up slowly from bed.  Avoiding sudden movements during the day.  Avoiding extreme head movement, like looking up or bending over.  Wearing a cervical collar to prevent sudden head movements. WHAT SHOULD I DO IF MY SYMPTOMS GET WORSE? Call your health care provider if your vertigo gets worse. Call your provider right way if you have other symptoms, including:   Nausea.  Vomiting.  Headache.  Weakness.  Numbness.  Vision changes.   This information is not intended to replace advice given to you by your health care provider. Make sure you  discuss any questions you have with your health care provider.   Document Released: 09/14/2013 Document Reviewed: 09/14/2013 Elsevier Interactive Patient Education 2016 [place] Headache Without Cause A headache is pain or discomfort felt around the head or neck area. The specific cause of a headache may not be found. There are many causes and types of headaches. A few common ones are:  Tension headaches.  Migraine headaches.  Cluster headaches.  Chronic daily headaches. HOME CARE INSTRUCTIONS  Watch your condition for any changes. Take these steps to help with your condition: Managing Pain  Take over-the-counter and prescription medicines only as told by your health care provider.  Lie down in a dark, quiet room when you have a headache.  If directed, apply ice to the head and neck area:  Put ice in a plastic bag.  Place a towel between your skin and the bag.  Leave the ice on for 20 minutes, 2-3 times per day.  Use a heating pad or hot shower to apply heat to the head and neck area as told by your health care provider.  Keep lights dim if bright lights bother you or make your headaches worse. Eating and Drinking  Eat meals on a regular schedule.  Limit alcohol use.  Decrease the amount of caffeine you drink, or stop drinking caffeine. General Instructions  Keep all follow-up visits as told by your health care provider. This is important.  Keep a headache journal to help find out what may trigger your headaches. For example, write down:  What you eat and drink.  How much sleep you get.  Any change to your diet or medicines.  Try massage or other relaxation techniques.  Limit stress.  Sit up straight, and do not tense your muscles.  Do not use tobacco products, including cigarettes, chewing tobacco, or e-cigarettes. If you need help quitting, ask your health care provider.  Exercise regularly as told by your health care provider.  Sleep on  a regular schedule. Get 7-9 hours of sleep, or the amount recommended by your health care provider. SEEK MEDICAL CARE IF:   Your symptoms are not helped by medicine.  You have a headache that is different from the usual headache.  You have nausea or you vomit.  You have a fever. SEEK IMMEDIATE MEDICAL CARE IF:   Your headache becomes severe.  You have repeated vomiting.  You have a stiff neck.  You have a loss of vision.  You have problems with speech.  You have pain in the eye or ear.  You have muscular weakness or loss of muscle control.  You lose your balance or have trouble walking.  You feel faint or pass out.  You have confusion.   This information is not intended to replace advice given to you by your health care provider. Make sure you discuss any questions you have with your health care provider.   Document Released: 09/09/2005 Document Revised: 05/31/2015 Document Reviewed: 01/02/2015 Elsevier Interactive Patient Education [employer].

## 2016-01-12 NOTE — ED Notes (Signed)
-  pt states while she was standing in the bathroom in front of the mirror getting ready for work she became dizzy and sat down on the toilet and after a minute or two she began having chest pain and SOB, states EMS came and checked her out and told her she was having a panic attack.. Pt states since she has continued to have dizziness and HA.Marland Kitchen Denies chest pain at present.Marland Kitchen
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
## 2016-01-25 ENCOUNTER — Emergency Department: Payer: BLUE CROSS/BLUE SHIELD

## 2016-01-25 ENCOUNTER — Emergency Department
Admission: EM | Admit: 2016-01-25 | Discharge: 2016-01-25 | Disposition: A | Discharge status: Home / Self Care | Payer: BLUE CROSS/BLUE SHIELD | Attending: Emergency Medicine | Admitting: Emergency Medicine

## 2016-01-25 ENCOUNTER — Other Ambulatory Visit: Payer: Self-pay

## 2016-01-25 ENCOUNTER — Encounter: Payer: Self-pay | Admitting: Emergency Medicine

## 2016-01-25 DIAGNOSIS — R109 Unspecified abdominal pain: Secondary | ICD-10-CM

## 2016-01-25 DIAGNOSIS — R51 Headache: Secondary | ICD-10-CM | POA: Insufficient documentation

## 2016-01-25 DIAGNOSIS — K920 Hematemesis: Secondary | ICD-10-CM | POA: Insufficient documentation

## 2016-01-25 DIAGNOSIS — Z87891 Personal history of nicotine dependence: Secondary | ICD-10-CM | POA: Diagnosis not present

## 2016-01-25 DIAGNOSIS — K273 Acute peptic ulcer, site unspecified, without hemorrhage or perforation: Secondary | ICD-10-CM | POA: Diagnosis not present

## 2016-01-25 DIAGNOSIS — R197 Diarrhea, unspecified: Secondary | ICD-10-CM | POA: Diagnosis not present

## 2016-01-25 DIAGNOSIS — K279 Peptic ulcer, site unspecified, unspecified as acute or chronic, without hemorrhage or perforation: Secondary | ICD-10-CM

## 2016-01-25 DIAGNOSIS — R1010 Upper abdominal pain, unspecified: Secondary | ICD-10-CM | POA: Diagnosis present

## 2016-01-25 LAB — CBC
HCT: 43.5 % (ref 35.0–47.0)
Hemoglobin: 14.1 g/dL (ref 12.0–16.0)
MCH: 26.8 pg (ref 26.0–34.0)
MCHC: 32.5 g/dL (ref 32.0–36.0)
MCV: 82.5 fL (ref 80.0–100.0)
PLATELETS: 240 10*3/uL (ref 150–440)
RBC: 5.28 MIL/uL — AB (ref 3.80–5.20)
RDW: 13.5 % (ref 11.5–14.5)
WBC: 14.2 10*3/uL — AB (ref 3.6–11.0)

## 2016-01-25 LAB — COMPREHENSIVE METABOLIC PANEL
ALK PHOS: 49 U/L (ref 38–126)
ALT: 26 U/L (ref 14–54)
AST: 27 U/L (ref 15–41)
Albumin: 4.2 g/dL (ref 3.5–5.0)
Anion gap: 12 (ref 5–15)
BILIRUBIN TOTAL: 0.8 mg/dL (ref 0.3–1.2)
BUN: 19 mg/dL (ref 6–20)
CALCIUM: 9.1 mg/dL (ref 8.9–10.3)
CO2: 20 mmol/L — ABNORMAL LOW (ref 22–32)
CREATININE: 0.61 mg/dL (ref 0.44–1.00)
Chloride: 106 mmol/L (ref 101–111)
Glucose, Bld: 107 mg/dL — ABNORMAL HIGH (ref 65–99)
Potassium: 3.9 mmol/L (ref 3.5–5.1)
Sodium: 138 mmol/L (ref 135–145)
Total Protein: 8.3 g/dL — ABNORMAL HIGH (ref 6.5–8.1)

## 2016-01-25 LAB — URINALYSIS COMPLETE WITH MICROSCOPIC (ARMC ONLY)
BILIRUBIN URINE: NEGATIVE
GLUCOSE, UA: NEGATIVE mg/dL
Ketones, ur: NEGATIVE mg/dL
LEUKOCYTES UA: NEGATIVE
Nitrite: NEGATIVE
Protein, ur: 30 mg/dL — AB
Specific Gravity, Urine: 1.026 (ref 1.005–1.030)
pH: 5 (ref 5.0–8.0)

## 2016-01-25 LAB — POCT PREGNANCY, URINE: PREG TEST UR: NEGATIVE

## 2016-01-25 LAB — LIPASE, BLOOD: Lipase: 18 U/L (ref 11–51)

## 2016-01-25 MED ORDER — METOCLOPRAMIDE HCL 5 MG/ML IJ SOLN
10.0000 mg | Freq: Once | INTRAMUSCULAR | Status: AC
  Administered 2016-01-25: 10 mg via INTRAVENOUS

## 2016-01-25 MED ORDER — ONDANSETRON 4 MG PO TBDP: 4.0000 mg | ORAL_TABLET | Freq: Four times a day (QID) | ORAL | Status: DC | PRN

## 2016-01-25 MED ORDER — ONDANSETRON HCL 4 MG/2ML IJ SOLN
4.0000 mg | Freq: Once | INTRAMUSCULAR | Status: AC | PRN
  Administered 2016-01-25: 4 mg via INTRAVENOUS
  Filled 2016-01-25: qty 2

## 2016-01-25 MED ORDER — HYDROCODONE-ACETAMINOPHEN 5-325 MG PO TABS: 1.0000 | ORAL_TABLET | Freq: Four times a day (QID) | ORAL | Status: DC | PRN

## 2016-01-25 MED ORDER — SODIUM CHLORIDE 0.9 % IV BOLUS (SEPSIS)
1000.0000 mL | Freq: Once | INTRAVENOUS | Status: AC
  Administered 2016-01-25: 1000 mL via INTRAVENOUS

## 2016-01-25 MED ORDER — MORPHINE SULFATE (PF) 4 MG/ML IV SOLN
4.0000 mg | Freq: Once | INTRAVENOUS | Status: AC
  Administered 2016-01-25: 4 mg via INTRAVENOUS

## 2016-01-25 MED ORDER — ONDANSETRON HCL 4 MG/2ML IJ SOLN: 4.0000 mg | Freq: Once | INTRAMUSCULAR | Status: DC

## 2016-01-25 MED ORDER — ESOMEPRAZOLE MAGNESIUM 20 MG PO CPDR: 20.0000 mg | DELAYED_RELEASE_CAPSULE | Freq: Every day | ORAL | Status: DC

## 2016-01-25 MED ORDER — FENTANYL CITRATE (PF) 100 MCG/2ML IJ SOLN
50.0000 ug | INTRAMUSCULAR | Status: DC | PRN
  Administered 2016-01-25: 50 ug via INTRAVENOUS
  Filled 2016-01-25: qty 2

## 2016-01-25 NOTE — ED Notes (Signed)
States 5 episodes of vomiting with the last episode having some blood, also states 3 episodes of loose stool, states then abd pain began, generalized burning abd pain

## 2016-01-25 NOTE — ED Notes (Signed)
Pt given crackers and water for PO challenge

## 2016-01-25 NOTE — ED Notes (Signed)
Patient presents to the ED with nausea, vomiting, diarrhea and upper abdominal pain that started today.  Patient appears uncomfortable.  Patient denies similar symptoms in the past.  Patient states pain feels like a "burning that is worse than giving birth."  Patient reports the last time she vomited, she vomited blood.  Patient states, "the toilet was pretty pink".  Patient states there was some bright red blood in the emesis.  Patient reports vomiting x 5 today, and diarrhea x 3.  Patient appears very uncomfortable in triage.

## 2016-01-25 NOTE — ED Notes (Signed)
Patient tolerated crackers and PO challenge well.
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
## 2016-01-25 NOTE — Discharge Instructions (Signed)
You were seen in the emergency room for abdominal pain. It is important that you follow up closely with your primary care doctor in the next couple of days.  STOP taking NSAIDS (like ibuprofen and exedrin)  If you're unable to see her primary care doctor you may return to the emergency room or go to the [place] walk-in clinic in 1 or 2 days for reexam.  Please return to the emergency room right away if you are to develop a fever, severe nausea, your pain becomes severe or worsens, you are unable to keep food down, begin vomiting any dark or bloody fluid, you develop any dark or bloody stools, feel dehydrated, or other new concerns or symptoms arise.  Abdominal Pain, Adult Many things can cause abdominal pain. Usually, abdominal pain is not caused by a disease and will improve without treatment. It can often be observed and treated at home. Your health care provider will do a physical exam and possibly order blood tests and X-rays to help determine the seriousness of your pain. However, in many cases, more time must pass before a clear cause of the pain can be found. Before that point, your health care provider may not know if you need more testing or further treatment. HOME CARE INSTRUCTIONS Monitor your abdominal pain for any changes. The following actions may help to alleviate any discomfort you are experiencing:  Only take over-the-counter or prescription medicines as directed by your health care provider.  Do not take laxatives unless directed to do so by your health care provider.  Try a clear liquid diet (broth, tea, or water) as directed by your health care provider. Slowly move to a bland diet as tolerated. SEEK MEDICAL CARE IF:  You have unexplained abdominal pain.  You have abdominal pain associated with nausea or diarrhea.  You have pain when you urinate or have a bowel movement.  You experience abdominal pain that wakes you in the night.  You have abdominal pain that is  worsened or improved by eating food.  You have abdominal pain that is worsened with eating fatty foods.  You have a fever. SEEK IMMEDIATE MEDICAL CARE IF:  Your pain does not go away within 2 hours.  You keep throwing up (vomiting).  Your pain is felt only in portions of the abdomen, such as the right side or the left lower portion of the abdomen.  You pass bloody or black tarry stools. MAKE SURE YOU:  Understand these instructions.  Will watch your condition.  Will get help right away if you are not doing well or get worse.   This information is not intended to replace advice given to you by your health care provider. Make sure you discuss any questions you have with your health care provider.   Document Released: 06/19/2005 Document Revised: 05/31/2015 Document Reviewed: 05/19/2013 Elsevier Interactive Patient Education 2016 Elsevier Inc.  Peptic Ulcer A peptic ulcer is a sore in the lining of your esophagus (esophageal ulcer), stomach (gastric ulcer), or in the first part of your small intestine (duodenal ulcer). The ulcer causes erosion into the deeper tissue. CAUSES  Normally, the lining of the stomach and the small intestine protects itself from the acid that digests food. The protective lining can be damaged by:  An infection caused by a bacterium called Helicobacter pylori (H. pylori).  Regular use of nonsteroidal anti-inflammatory drugs (NSAIDs), such as ibuprofen or aspirin.  Smoking tobacco. Other risk factors include being older than 50, drinking alcohol excessively, and  having a family history of ulcer disease.  SYMPTOMS   Burning pain or gnawing in the area between the chest and the belly button.  Heartburn.  Nausea and vomiting.  Bloating. The pain can be worse on an empty stomach and at night. If the ulcer results in bleeding, it can cause:  Black, tarry stools.  Vomiting of bright red blood.  Vomiting of coffee-ground-looking  materials. DIAGNOSIS  A diagnosis is usually made based upon your history and an exam. Other tests and procedures may be performed to find the cause of the ulcer. Finding a cause will help determine the best treatment. Tests and procedures may include:  Blood tests, stool tests, or breath tests to check for the bacterium H. pylori.  An upper gastrointestinal (GI) series of the esophagus, stomach, and small intestine.  An endoscopy to examine the esophagus, stomach, and small intestine.  A biopsy. TREATMENT  Treatment may include:  Eliminating the cause of the ulcer, such as smoking, NSAIDs, or alcohol.  Medicines to reduce the amount of acid in your digestive tract.  Antibiotic medicines if the ulcer is caused by the H. pylori bacterium.  An upper endoscopy to treat a bleeding ulcer.  Surgery if the bleeding is severe or if the ulcer created a hole somewhere in the digestive system. HOME CARE INSTRUCTIONS   Avoid tobacco, alcohol, and caffeine. Smoking can increase the acid in the stomach, and continued smoking will impair the healing of ulcers.  Avoid foods and drinks that seem to cause discomfort or aggravate your ulcer.  Only take medicines as directed by your caregiver. Do not substitute over-the-counter medicines for prescription medicines without talking to your caregiver.  Keep any follow-up appointments and tests as directed. SEEK MEDICAL CARE IF:   Your do not improve within 7 days of starting treatment.  You have ongoing indigestion or heartburn. SEEK IMMEDIATE MEDICAL CARE IF:   You have sudden, sharp, or persistent abdominal pain.  You have bloody or dark black, tarry stools.  You vomit blood or vomit that looks like coffee grounds.  You become light-headed, weak, or feel faint.  You become sweaty or clammy. MAKE SURE YOU:   Understand these instructions.  Will watch your condition.  Will get help right away if you are not doing well or get worse.    This information is not intended to replace advice given to you by your health care provider. Make sure you discuss any questions you have with your health care provider.   Document Released: 09/06/2000 Document Revised: 09/30/2014 Document Reviewed: 04/08/2012 Elsevier Interactive Patient Education [employer].

## 2016-01-25 NOTE — ED Notes (Signed)
MD notified of patient c/o sudden onset SHOB after receiving 4mg  of Morphine. Pt alert and oriented, lung sounds clear at this time. Pt able to speak with no difficulty at this time.

## 2016-02-28 ENCOUNTER — Emergency Department: Payer: No Typology Code available for payment source

## 2016-02-28 ENCOUNTER — Encounter: Payer: Self-pay | Admitting: Emergency Medicine

## 2016-02-28 ENCOUNTER — Emergency Department
Admission: EM | Admit: 2016-02-28 | Discharge: 2016-02-28 | Disposition: A | Discharge status: Home / Self Care | Payer: No Typology Code available for payment source | Attending: Emergency Medicine | Admitting: Emergency Medicine

## 2016-02-28 DIAGNOSIS — S40012A Contusion of left shoulder, initial encounter: Secondary | ICD-10-CM | POA: Insufficient documentation

## 2016-02-28 DIAGNOSIS — Y9389 Activity, other specified: Secondary | ICD-10-CM | POA: Insufficient documentation

## 2016-02-28 DIAGNOSIS — Y92410 Unspecified street and highway as the place of occurrence of the external cause: Secondary | ICD-10-CM | POA: Diagnosis not present

## 2016-02-28 DIAGNOSIS — Z3490 Encounter for supervision of normal pregnancy, unspecified, unspecified trimester: Secondary | ICD-10-CM

## 2016-02-28 DIAGNOSIS — Z7952 Long term (current) use of systemic steroids: Secondary | ICD-10-CM | POA: Insufficient documentation

## 2016-02-28 DIAGNOSIS — S4992XA Unspecified injury of left shoulder and upper arm, initial encounter: Secondary | ICD-10-CM | POA: Diagnosis present

## 2016-02-28 DIAGNOSIS — Z87891 Personal history of nicotine dependence: Secondary | ICD-10-CM | POA: Insufficient documentation

## 2016-02-28 DIAGNOSIS — Z79899 Other long term (current) drug therapy: Secondary | ICD-10-CM | POA: Insufficient documentation

## 2016-02-28 DIAGNOSIS — Y999 Unspecified external cause status: Secondary | ICD-10-CM | POA: Insufficient documentation

## 2016-02-28 DIAGNOSIS — S80812A Abrasion, left lower leg, initial encounter: Secondary | ICD-10-CM | POA: Insufficient documentation

## 2016-02-28 DIAGNOSIS — Z3201 Encounter for pregnancy test, result positive: Secondary | ICD-10-CM | POA: Insufficient documentation

## 2016-02-28 DIAGNOSIS — O9A211 Injury, poisoning and certain other consequences of external causes complicating pregnancy, first trimester: Secondary | ICD-10-CM | POA: Insufficient documentation

## 2016-02-28 DIAGNOSIS — T07XXXA Unspecified multiple injuries, initial encounter: Secondary | ICD-10-CM

## 2016-02-28 DIAGNOSIS — Z3A00 Weeks of gestation of pregnancy not specified: Secondary | ICD-10-CM | POA: Diagnosis not present

## 2016-02-28 DIAGNOSIS — G8911 Acute pain due to trauma: Secondary | ICD-10-CM

## 2016-02-28 DIAGNOSIS — M25512 Pain in left shoulder: Secondary | ICD-10-CM

## 2016-02-28 LAB — POCT PREGNANCY, URINE: PREG TEST UR: POSITIVE — AB

## 2016-02-28 MED ORDER — ACETAMINOPHEN 500 MG PO TABS
ORAL_TABLET | ORAL | Status: AC
  Filled 2016-02-28: qty 2

## 2016-02-28 MED ORDER — ACETAMINOPHEN 500 MG PO TABS
1000.0000 mg | ORAL_TABLET | Freq: Once | ORAL | Status: AC
  Administered 2016-02-28: 1000 mg via ORAL
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
## 2016-02-28 NOTE — Discharge Instructions (Signed)
Ice to abrasions as needed for pain control and soft tissue swelling. Tylenol and only as needed for pain. Follow-up with your primary care doctor if any continued problems or concerns. You will need to also follow up with an OB/GYN for your pregnancy.
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
## 2016-03-14 LAB — OB RESULTS CONSOLE HIV ANTIBODY (ROUTINE TESTING): HIV: NONREACTIVE

## 2016-03-14 LAB — OB RESULTS CONSOLE VARICELLA ZOSTER ANTIBODY, IGG: VARICELLA IGG: IMMUNE

## 2016-03-14 LAB — OB RESULTS CONSOLE RPR: RPR: NONREACTIVE

## 2016-03-14 LAB — OB RESULTS CONSOLE RUBELLA ANTIBODY, IGM: RUBELLA: IMMUNE

## 2016-03-14 LAB — OB RESULTS CONSOLE HEPATITIS B SURFACE ANTIGEN: Hepatitis B Surface Ag: NEGATIVE

## 2016-08-09 LAB — OB RESULTS CONSOLE HIV ANTIBODY (ROUTINE TESTING): HIV: NONREACTIVE

## 2016-08-09 LAB — OB RESULTS CONSOLE RPR: RPR: NONREACTIVE

## 2016-08-12 ENCOUNTER — Inpatient Hospital Stay
Admission: EM | Admit: 2016-08-12 | Discharge: 2016-08-13 | Disposition: A | Discharge status: Home / Self Care | Payer: Medicaid Other | Attending: Obstetrics and Gynecology | Admitting: Obstetrics and Gynecology

## 2016-08-12 DIAGNOSIS — O99343 Other mental disorders complicating pregnancy, third trimester: Secondary | ICD-10-CM | POA: Insufficient documentation

## 2016-08-12 DIAGNOSIS — Z87891 Personal history of nicotine dependence: Secondary | ICD-10-CM | POA: Insufficient documentation

## 2016-08-12 DIAGNOSIS — N898 Other specified noninflammatory disorders of vagina: Secondary | ICD-10-CM | POA: Insufficient documentation

## 2016-08-12 DIAGNOSIS — O26893 Other specified pregnancy related conditions, third trimester: Secondary | ICD-10-CM | POA: Insufficient documentation

## 2016-08-12 DIAGNOSIS — F329 Major depressive disorder, single episode, unspecified: Secondary | ICD-10-CM | POA: Insufficient documentation

## 2016-08-12 DIAGNOSIS — F4310 Post-traumatic stress disorder, unspecified: Secondary | ICD-10-CM | POA: Insufficient documentation

## 2016-08-12 DIAGNOSIS — Z3A28 28 weeks gestation of pregnancy: Secondary | ICD-10-CM | POA: Insufficient documentation

## 2016-08-12 HISTORY — DX: Gestational (pregnancy-induced) hypertension without significant proteinuria, unspecified trimester: O13.9

## 2016-08-12 NOTE — Discharge Instructions (Signed)
Call provider or return to birthplace with: ? ?1. Regular contractions ?2. Leaking of fluid from your vagina ?3. Vaginal bleeding: Bright red or heavy like a period ?4. Decreased Fetal movement  ?

## 2016-08-12 NOTE — OB Triage Note (Signed)
Pt reports feeling wetness and leaking fluid since 1730, that has continued since then. Pt reports thinking she was urinating when she was coughing but it continued even when she coughs and pt became concerned. Pt has been on antibiotics and finished them yesterday for bronchitis. Denies abd pain, vaginal bleeding, and reports good fetal movement. EFM applied and explained. Plan to monitor fetal and maternal well being and assess for ROM

## 2016-08-13 DIAGNOSIS — N898 Other specified noninflammatory disorders of vagina: Secondary | ICD-10-CM | POA: Diagnosis present

## 2016-08-13 DIAGNOSIS — F329 Major depressive disorder, single episode, unspecified: Secondary | ICD-10-CM | POA: Diagnosis not present

## 2016-08-13 DIAGNOSIS — F4310 Post-traumatic stress disorder, unspecified: Secondary | ICD-10-CM | POA: Diagnosis not present

## 2016-08-13 DIAGNOSIS — O26893 Other specified pregnancy related conditions, third trimester: Secondary | ICD-10-CM | POA: Diagnosis not present

## 2016-08-13 DIAGNOSIS — Z87891 Personal history of nicotine dependence: Secondary | ICD-10-CM | POA: Diagnosis not present

## 2016-08-13 DIAGNOSIS — Z3A28 28 weeks gestation of pregnancy: Secondary | ICD-10-CM | POA: Diagnosis not present

## 2016-08-13 DIAGNOSIS — O99343 Other mental disorders complicating pregnancy, third trimester: Secondary | ICD-10-CM | POA: Diagnosis not present
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
## 2016-08-29 ENCOUNTER — Observation Stay
Admission: EM | Admit: 2016-08-29 | Discharge: 2016-08-29 | Disposition: A | Discharge status: Home / Self Care | Payer: Medicaid Other | Attending: Obstetrics & Gynecology | Admitting: Obstetrics & Gynecology

## 2016-08-29 DIAGNOSIS — Z87891 Personal history of nicotine dependence: Secondary | ICD-10-CM | POA: Diagnosis not present

## 2016-08-29 DIAGNOSIS — Z3A31 31 weeks gestation of pregnancy: Secondary | ICD-10-CM | POA: Insufficient documentation

## 2016-08-29 DIAGNOSIS — O99213 Obesity complicating pregnancy, third trimester: Secondary | ICD-10-CM | POA: Diagnosis not present

## 2016-08-29 DIAGNOSIS — E669 Obesity, unspecified: Secondary | ICD-10-CM | POA: Diagnosis not present

## 2016-08-29 DIAGNOSIS — R109 Unspecified abdominal pain: Secondary | ICD-10-CM | POA: Diagnosis present

## 2016-08-29 DIAGNOSIS — O26893 Other specified pregnancy related conditions, third trimester: Secondary | ICD-10-CM | POA: Diagnosis not present

## 2016-08-29 LAB — URINALYSIS, COMPLETE (UACMP) WITH MICROSCOPIC
BILIRUBIN URINE: NEGATIVE
Bacteria, UA: NONE SEEN
GLUCOSE, UA: NEGATIVE mg/dL
HGB URINE DIPSTICK: NEGATIVE
Ketones, ur: NEGATIVE mg/dL
LEUKOCYTES UA: NEGATIVE
NITRITE: NEGATIVE
PROTEIN: NEGATIVE mg/dL
Specific Gravity, Urine: 1.025 (ref 1.005–1.030)
pH: 6 (ref 5.0–8.0)

## 2016-08-29 NOTE — OB Triage Note (Signed)
Julie Estrada here with lower abdominal pain that she states "is like a ripping feeling from bottom to top" demonstrating from top of pubic are to naval. Pt states pain is 10/10, started this AM, no burning with urination, no bleeding, no LOF. Decreased fetal movement. Denies N/V/D.

## 2016-08-29 NOTE — Discharge Summary (Signed)
Physician Discharge Summary  Patient ID: Julie Estrada MRN: [id number] DOB/AGE: 1990-12-14 25 y.o.  Admit date: 08/29/2016 Discharge date: 08/29/2016  Admission Diagnoses:  Discharge Diagnoses:  Active Problems:   Labor and delivery, indication for care   Discharged Condition: good  Hospital Course: Seen and examined, see H&P.  No s/sx PTL, Abruption, Infection, UTI.  Likely suprapubic pain related to ligament strain.  Consults: None  Significant Diagnostic Studies: A NST procedure was performed with FHR monitoring and a normal baseline established, appropriate time of 20-40 minutes of evaluation, and accels >2 seen w 15x15 characteristics.  Results show a REACTIVE NST.   Discharge Exam: Blood pressure 120/76, pulse 92, temperature 98.3 F (36.8 C), temperature source Oral, resp. rate 18, last menstrual period 10/23/2015. see HPI, no changes  Disposition: 01-Home or Self Care     Medication List    TAKE these medications   esomeprazole 20 MG capsule Commonly known as:  NEXIUM Take 1 capsule (20 mg total) by mouth daily.        Signed: Hoyt Koch 08/29/2016, 6:40 PM
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
## 2016-08-31 LAB — URINE CULTURE

## 2016-09-19 ENCOUNTER — Observation Stay
Admission: EM | Admit: 2016-09-19 | Discharge: 2016-09-19 | Disposition: A | Discharge status: Home / Self Care | Payer: Medicaid Other | Attending: Certified Nurse Midwife | Admitting: Certified Nurse Midwife

## 2016-09-19 DIAGNOSIS — O4703 False labor before 37 completed weeks of gestation, third trimester: Principal | ICD-10-CM | POA: Insufficient documentation

## 2016-09-19 DIAGNOSIS — Z3A34 34 weeks gestation of pregnancy: Secondary | ICD-10-CM | POA: Diagnosis not present

## 2016-09-19 DIAGNOSIS — Z79899 Other long term (current) drug therapy: Secondary | ICD-10-CM | POA: Diagnosis not present

## 2016-09-19 LAB — URINALYSIS, COMPLETE (UACMP) WITH MICROSCOPIC
BILIRUBIN URINE: NEGATIVE
Bacteria, UA: NONE SEEN
GLUCOSE, UA: NEGATIVE mg/dL
HGB URINE DIPSTICK: NEGATIVE
KETONES UR: NEGATIVE mg/dL
LEUKOCYTES UA: NEGATIVE
NITRITE: NEGATIVE
PH: 7 (ref 5.0–8.0)
Protein, ur: NEGATIVE mg/dL
SPECIFIC GRAVITY, URINE: 1.009 (ref 1.005–1.030)

## 2016-09-19 NOTE — Final Progress Note (Signed)
Physician Final Progress Note  Patient ID: Julie Estrada MRN: [id number] DOB/AGE: 1991-07-09 25 y.o.  Admit date: 09/19/2016 Admitting provider: Will Bonnet, MD and Dalia Heading, CNM Discharge date: 09/19/2016   Admission Diagnoses: IUP at 34wk1d with threatened preterm labor  Discharge Diagnoses:  False labor  Consults: None  Significant Findings/ Diagnostic Studies:   Procedures: NST Baseline: 135 Variability: marked Accelerations: present Decelerations: absent Tocometry: infrequent (~Q 12 minutes) Interpretation: reactive NST (category 1)   Discharge Condition: stable  Disposition: 01-Home or Self Care  Diet: Regular diet  Discharge Activity: Activity as tolerated   Allergies as of 09/19/2016      Reactions   Iodine Anaphylaxis   Shrimp [shellfish Allergy] Anaphylaxis   Penicillins Nausea And Vomiting      Medication List    TAKE these medications   esomeprazole 20 MG capsule Commonly known as:  NEXIUM Take 1 capsule (20 mg total) by mouth daily.       Total time spent taking care of this patient: 20 minutes  Signed: Will Bonnet 09/19/2016, 1:48 PM

## 2016-09-19 NOTE — OB Triage Note (Signed)
Pt arrived from ED to OBSrm 2 with c/o irregular contractions starting 0800 this morning and progressing to regular contractions every 6-8 minutes. No leaking of fluid, no bleeding, and no recent sex. Pt placed on monitors and oriented to room.

## 2016-09-19 NOTE — Discharge Instructions (Signed)
Discharge instructions given, labor precautions given, and fetal movement instructions given.

## 2016-09-19 NOTE — Discharge Summary (Signed)
Physician Final Progress Note  Patient ID: Julie Estrada MRN: [id number] DOB/AGE: 1991/06/09 25 y.o.  Admit date: 09/19/2016 Admitting provider: Will Bonnet, MD and Dalia Heading, CNM Discharge date: 09/19/2016   Admission Diagnoses: IUP at 34wk1d with threatened preterm labor  Discharge Diagnoses:  False labor  Consults: None  Significant Findings/ Diagnostic Studies:   Procedures: NST Baseline: 135 Variability: marked Accelerations: present Decelerations: absent Tocometry: infrequent (~Q 12 minutes) Interpretation: reactive NST (category 1)   Discharge Condition: stable  Disposition: 01-Home or Self Care  Diet: Regular diet  Discharge Activity: Activity as tolerated       Allergies as of 09/19/2016      Reactions   Iodine Anaphylaxis   Shrimp [shellfish Allergy] Anaphylaxis   Penicillins Nausea And Vomiting         Medication List    TAKE these medications   esomeprazole 20 MG capsule Commonly known as:  NEXIUM Take 1 capsule (20 mg total) by mouth daily.      Total time spent taking care of this patient: 20 minutes  Signed: Prentice Docker, MD 09/19/2016 1:49 PM

## 2016-09-23 ENCOUNTER — Observation Stay
Admission: EM | Admit: 2016-09-23 | Discharge: 2016-09-23 | Disposition: A | Discharge status: Home / Self Care | Payer: Medicaid Other | Attending: Obstetrics and Gynecology | Admitting: Obstetrics and Gynecology

## 2016-09-23 ENCOUNTER — Encounter: Payer: Self-pay | Admitting: *Deleted

## 2016-09-23 DIAGNOSIS — A084 Viral intestinal infection, unspecified: Secondary | ICD-10-CM | POA: Insufficient documentation

## 2016-09-23 DIAGNOSIS — O99613 Diseases of the digestive system complicating pregnancy, third trimester: Principal | ICD-10-CM | POA: Insufficient documentation

## 2016-09-23 DIAGNOSIS — Z87891 Personal history of nicotine dependence: Secondary | ICD-10-CM | POA: Insufficient documentation

## 2016-09-23 DIAGNOSIS — Z79899 Other long term (current) drug therapy: Secondary | ICD-10-CM | POA: Diagnosis not present

## 2016-09-23 DIAGNOSIS — Z3A34 34 weeks gestation of pregnancy: Secondary | ICD-10-CM | POA: Insufficient documentation

## 2016-09-23 DIAGNOSIS — K529 Noninfective gastroenteritis and colitis, unspecified: Secondary | ICD-10-CM | POA: Diagnosis present

## 2016-09-23 DIAGNOSIS — R112 Nausea with vomiting, unspecified: Secondary | ICD-10-CM | POA: Diagnosis present

## 2016-09-23 MED ORDER — SODIUM CHLORIDE 0.9 % IV BOLUS (SEPSIS)
1000.0000 mL | Freq: Once | INTRAVENOUS | Status: AC
  Administered 2016-09-23: 1000 mL via INTRAVENOUS

## 2016-09-23 MED ORDER — SODIUM CHLORIDE 0.9 % IV SOLN: 8.0000 mg | Freq: Four times a day (QID) | INTRAVENOUS | Status: DC | PRN

## 2016-09-23 MED ORDER — ONDANSETRON HCL 4 MG/2ML IJ SOLN
8.0000 mg | Freq: Four times a day (QID) | INTRAMUSCULAR | Status: DC | PRN
Start: 2016-09-23 — End: 2016-09-23
  Administered 2016-09-23: 8 mg via INTRAVENOUS

## 2016-09-23 MED ORDER — ONDANSETRON HCL 4 MG/2ML IJ SOLN
INTRAMUSCULAR | Status: AC
  Administered 2016-09-23: 8 mg via INTRAVENOUS
  Filled 2016-09-23: qty 4

## 2016-09-23 MED ORDER — ONDANSETRON 8 MG PO TBDP: 8.0000 mg | ORAL_TABLET | Freq: Three times a day (TID) | ORAL | 0 refills | Status: DC | PRN

## 2016-09-23 NOTE — Progress Notes (Signed)
Discharge instructions given and explained.  Verbalized understanding.  Signed copy on chart and one in hand.
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
## 2016-09-23 NOTE — Discharge Summary (Signed)
See final progress note. Patient was OB triage only and had very short stay. All pertinent documentation is in Final Progress Note  Prentice Docker, MD 09/23/2016 5:09 PM

## 2016-09-23 NOTE — OB Triage Note (Signed)
Recvd to OBS 2 per wheelchair with c/o vomiting 5 times since 0430 this morning.  Changed to gown and to bed.  EFM applied.  Oriented to room and plan of care. Comfort measures performed.  Positioned to comfort.

## 2016-09-23 NOTE — ED Notes (Signed)
Pt transported to LDR via wheelchair by Lanny Hurst, ED Medic. Pt in NAD. Taken to OBS 2

## 2016-10-10 ENCOUNTER — Inpatient Hospital Stay
Admission: EM | Admit: 2016-10-10 | Discharge: 2016-10-14 | DRG: 766 | Disposition: A | Discharge status: Home / Self Care | Payer: Medicaid Other | Attending: Obstetrics and Gynecology | Admitting: Obstetrics and Gynecology

## 2016-10-10 DIAGNOSIS — Z8632 Personal history of gestational diabetes: Secondary | ICD-10-CM

## 2016-10-10 DIAGNOSIS — D649 Anemia, unspecified: Secondary | ICD-10-CM | POA: Diagnosis not present

## 2016-10-10 DIAGNOSIS — Z87891 Personal history of nicotine dependence: Secondary | ICD-10-CM

## 2016-10-10 DIAGNOSIS — O479 False labor, unspecified: Secondary | ICD-10-CM | POA: Diagnosis present

## 2016-10-10 DIAGNOSIS — Z302 Encounter for sterilization: Secondary | ICD-10-CM

## 2016-10-10 DIAGNOSIS — Z3A37 37 weeks gestation of pregnancy: Secondary | ICD-10-CM

## 2016-10-10 DIAGNOSIS — O4202 Full-term premature rupture of membranes, onset of labor within 24 hours of rupture: Secondary | ICD-10-CM | POA: Diagnosis present

## 2016-10-10 DIAGNOSIS — O9081 Anemia of the puerperium: Secondary | ICD-10-CM | POA: Diagnosis not present

## 2016-10-10 MED ORDER — ACETAMINOPHEN 325 MG PO TABS: 650.0000 mg | ORAL_TABLET | ORAL | Status: DC | PRN

## 2016-10-10 MED ORDER — LACTATED RINGERS IV SOLN
INTRAVENOUS | Status: DC
  Administered 2016-10-11: 01:00:00 via INTRAVENOUS

## 2016-10-10 MED ORDER — PRENATAL MULTIVITAMIN CH
1.0000 | ORAL_TABLET | Freq: Every day | ORAL | Status: DC
  Filled 2016-10-10: qty 1

## 2016-10-10 MED ORDER — DOCUSATE SODIUM 100 MG PO CAPS: 100.0000 mg | ORAL_CAPSULE | Freq: Every day | ORAL | Status: DC

## 2016-10-10 MED ORDER — CALCIUM CARBONATE ANTACID 500 MG PO CHEW: 2.0000 | CHEWABLE_TABLET | ORAL | Status: DC | PRN

## 2016-10-10 MED ORDER — ZOLPIDEM TARTRATE 5 MG PO TABS
5.0000 mg | ORAL_TABLET | Freq: Once | ORAL | Status: AC
  Administered 2016-10-10: 5 mg via ORAL
  Filled 2016-10-10: qty 1

## 2016-10-10 MED ORDER — LACTATED RINGERS IV BOLUS (SEPSIS)
1000.0000 mL | Freq: Once | INTRAVENOUS | Status: AC
  Administered 2016-10-10: 1000 mL via INTRAVENOUS

## 2016-10-10 MED ORDER — ZOLPIDEM TARTRATE 5 MG PO TABS
5.0000 mg | ORAL_TABLET | Freq: Once | ORAL | Status: AC | PRN
  Administered 2016-10-11: 5 mg via ORAL
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
## 2016-10-11 ENCOUNTER — Observation Stay: Payer: Medicaid Other | Admitting: Anesthesiology

## 2016-10-11 ENCOUNTER — Encounter: Payer: Self-pay | Admitting: Anesthesiology

## 2016-10-11 LAB — OB RESULTS CONSOLE GC/CHLAMYDIA
CHLAMYDIA, DNA PROBE: NEGATIVE
GC PROBE AMP, GENITAL: NEGATIVE

## 2016-10-11 LAB — CBC
HCT: 31.1 % — ABNORMAL LOW (ref 35.0–47.0)
Hemoglobin: 10.3 g/dL — ABNORMAL LOW (ref 12.0–16.0)
MCH: 25.1 pg — ABNORMAL LOW (ref 26.0–34.0)
MCHC: 33.2 g/dL (ref 32.0–36.0)
MCV: 75.6 fL — ABNORMAL LOW (ref 80.0–100.0)
PLATELETS: 178 10*3/uL (ref 150–440)
RBC: 4.11 MIL/uL (ref 3.80–5.20)
RDW: 15.5 % — AB (ref 11.5–14.5)
WBC: 6.4 10*3/uL (ref 3.6–11.0)

## 2016-10-11 LAB — ABO/RH: ABO/RH(D): A POS

## 2016-10-11 LAB — CHLAMYDIA/NGC RT PCR (ARMC ONLY)
CHLAMYDIA TR: NOT DETECTED
N gonorrhoeae: NOT DETECTED

## 2016-10-11 MED ORDER — PHENYLEPHRINE 40 MCG/ML (10ML) SYRINGE FOR IV PUSH (FOR BLOOD PRESSURE SUPPORT): 80.0000 ug | PREFILLED_SYRINGE | INTRAVENOUS | Status: DC | PRN

## 2016-10-11 MED ORDER — BUPIVACAINE HCL (PF) 0.25 % IJ SOLN
INTRAMUSCULAR | Status: DC | PRN
  Administered 2016-10-11: 5 mL via EPIDURAL

## 2016-10-11 MED ORDER — SOD CITRATE-CITRIC ACID 500-334 MG/5ML PO SOLN
30.0000 mL | ORAL | Status: DC | PRN
Start: 2016-10-11 — End: 2016-10-12
  Filled 2016-10-11: qty 15

## 2016-10-11 MED ORDER — OXYTOCIN BOLUS FROM INFUSION: 500.0000 mL | Freq: Once | INTRAVENOUS | Status: DC

## 2016-10-11 MED ORDER — LIDOCAINE-EPINEPHRINE (PF) 1.5 %-1:200000 IJ SOLN
INTRAMUSCULAR | Status: DC | PRN
  Administered 2016-10-11: 3 mL via PERINEURAL

## 2016-10-11 MED ORDER — OXYTOCIN 40 UNITS IN LACTATED RINGERS INFUSION - SIMPLE MED
2.5000 [IU]/h | INTRAVENOUS | Status: DC
  Administered 2016-10-12: 500 mL via INTRAVENOUS

## 2016-10-11 MED ORDER — OXYTOCIN 40 UNITS IN LACTATED RINGERS INFUSION - SIMPLE MED
1.0000 m[IU]/min | INTRAVENOUS | Status: DC
  Administered 2016-10-11: 2 m[IU]/min via INTRAVENOUS

## 2016-10-11 MED ORDER — LACTATED RINGERS IV SOLN
INTRAVENOUS | Status: DC
  Administered 2016-10-11: 125 mL via INTRAVENOUS
  Administered 2016-10-12: 03:00:00 via INTRAVENOUS

## 2016-10-11 MED ORDER — DIPHENHYDRAMINE HCL 50 MG/ML IJ SOLN: 12.5000 mg | INTRAMUSCULAR | Status: DC | PRN

## 2016-10-11 MED ORDER — LIDOCAINE HCL (PF) 1 % IJ SOLN: 30.0000 mL | INTRAMUSCULAR | Status: DC | PRN

## 2016-10-11 MED ORDER — FENTANYL 2.5 MCG/ML W/ROPIVACAINE 0.2% IN NS 100 ML EPIDURAL INFUSION (ARMC-ANES)
10.0000 mL/h | EPIDURAL | Status: DC
  Administered 2016-10-12: 10 mL/h via EPIDURAL
  Filled 2016-10-11: qty 100

## 2016-10-11 MED ORDER — ONDANSETRON HCL 4 MG/2ML IJ SOLN
4.0000 mg | Freq: Four times a day (QID) | INTRAMUSCULAR | Status: DC | PRN
Start: 2016-10-11 — End: 2016-10-12
  Administered 2016-10-12: 4 mg via INTRAVENOUS
  Filled 2016-10-11: qty 2

## 2016-10-11 MED ORDER — EPHEDRINE 5 MG/ML INJ: 10.0000 mg | INTRAVENOUS | Status: DC | PRN

## 2016-10-11 MED ORDER — FENTANYL 2.5 MCG/ML W/ROPIVACAINE 0.2% IN NS 100 ML EPIDURAL INFUSION (ARMC-ANES)
EPIDURAL | Status: DC | PRN
  Administered 2016-10-11: 10 mL/h via EPIDURAL

## 2016-10-11 MED ORDER — ACETAMINOPHEN 325 MG PO TABS
650.0000 mg | ORAL_TABLET | ORAL | Status: DC | PRN
  Administered 2016-10-11: 650 mg via ORAL
  Filled 2016-10-11: qty 2

## 2016-10-11 MED ORDER — FENTANYL 2.5 MCG/ML W/ROPIVACAINE 0.2% IN NS 100 ML EPIDURAL INFUSION (ARMC-ANES)
EPIDURAL | Status: AC
  Filled 2016-10-11: qty 100

## 2016-10-11 MED ORDER — LIDOCAINE HCL (PF) 1 % IJ SOLN
INTRAMUSCULAR | Status: DC | PRN
  Administered 2016-10-11: 2 mL
  Administered 2016-10-11: 3 mL

## 2016-10-11 MED ORDER — CEFAZOLIN SODIUM-DEXTROSE 2-4 GM/100ML-% IV SOLN
2.0000 g | INTRAVENOUS | Status: AC
  Administered 2016-10-11: 2 g via INTRAVENOUS
  Filled 2016-10-11: qty 100

## 2016-10-11 MED ORDER — LACTATED RINGERS IV SOLN: 500.0000 mL | Freq: Once | INTRAVENOUS | Status: DC

## 2016-10-11 MED ORDER — MISOPROSTOL 25 MCG QUARTER TABLET
25.0000 ug | ORAL_TABLET | ORAL | Status: DC | PRN
  Administered 2016-10-11 – 2016-10-12 (×3): 25 ug via VAGINAL
  Filled 2016-10-11: qty 0.25
  Filled 2016-10-11: qty 1

## 2016-10-11 MED ORDER — BUTORPHANOL TARTRATE 1 MG/ML IJ SOLN: 1.0000 mg | INTRAMUSCULAR | Status: DC | PRN

## 2016-10-11 MED ORDER — TERBUTALINE SULFATE 1 MG/ML IJ SOLN: 0.2500 mg | Freq: Once | INTRAMUSCULAR | Status: DC | PRN

## 2016-10-11 MED ORDER — LACTATED RINGERS IV SOLN
500.0000 mL | INTRAVENOUS | Status: DC | PRN
  Administered 2016-10-11 (×2): 500 mL via INTRAVENOUS

## 2016-10-11 MED ORDER — CEFAZOLIN IN D5W 1 GM/50ML IV SOLN
1.0000 g | Freq: Three times a day (TID) | INTRAVENOUS | Status: DC
  Administered 2016-10-11 – 2016-10-12 (×3): 1 g via INTRAVENOUS
  Filled 2016-10-11 (×5): qty 50

## 2016-10-11 NOTE — Progress Notes (Signed)
Lunch ordered around 1040 a.m. Lunch tray now here,  Currently eating regular diet before starting Pitocin. Ancef 2 g started at 11:00 a.m. for GBS unknown - called [facility] specimen sent in last Friday, due to weather, lab un resulted.

## 2016-10-11 NOTE — Progress Notes (Signed)
Current level at T6, pt. Denies SOB, O2 discontinued. Pt. Removed from face.

## 2016-10-11 NOTE — Progress Notes (Signed)
See prior notes from Dr. Georgianne Fick.  Discussion held with patient regarding options to deliver.  Patient reported history of shoulder dystocia.  Thorough review of records does not indicate a shoulder dystocia.  2 hours of pushing and it appears the baby was somewhat stunned.  However, the baby recovered well.  This fetus is measured to be somewhat larger (8lb 11oz).  Discussed that she has delivered successfully reasonable sized baby and did well, per the records.  Discussed options for delivery again. However, strongly recommended attempt at vaginal delivery.  Discussed risks of cesarean vs vaginal delivery.  Given measurements of baby, will watch labor curve closely.  Patient has agree to attempt vaginal delivery.    Prentice Docker, MD 10/11/2016 10:14 AM

## 2016-10-11 NOTE — Progress Notes (Signed)
Labor Check  Subj:  Complaints: comfortable with epidural   Obj:  BP 108/60 (BP Location: Right Arm)   Pulse 71   Temp 98.4 F (36.9 C) (Oral)   Resp 16   Ht 5\' 3"  (1.6 m)   Wt 239 lb (108.4 kg)   LMP 10/23/2015 Comment: every 3 months - BCP; preg test neg  SpO2 100%   BMI 42.34 kg/m  Dose (milli-units/min) Oxytocin: 12 milli-units/min  Cervix: Dilation: 1 / Effacement (%): 50 / Station: Ballotable  Baseline FHR: 135 bpm    Variability: moderate    Accelerations: present    Decelerations: absent Contractions: present  frequency: 4 q 10 min  A/P: 26 y.o. G3P0011 female at [redacted]w[redacted]d with PROM.  1.  Labor: stop pitocin for now.  Will switch agents to misoprostol. Discussed r/b/a. Patient agrees.  2.  FWB: reassuring overall, Overall assessment: category 1  3.  GBS unknown  4.  Pain: epidural 5.  Recheck: q 4 hours, prn   Prentice Docker, MD 10/11/2016 9:20 PM

## 2016-10-11 NOTE — Anesthesia Preprocedure Evaluation (Signed)
Anesthesia Evaluation  Patient identified by MRN, date of birth, ID band Patient awake    Reviewed: Allergy & Precautions, NPO status , Patient's Chart, lab work & pertinent test results, reviewed documented beta blocker date and time   Airway Mallampati: II  TM Distance: >3 FB     Dental  (+) Chipped   Pulmonary former smoker,           Cardiovascular hypertension,      Neuro/Psych  Headaches, PSYCHIATRIC DISORDERS Anxiety    GI/Hepatic   Endo/Other  diabetes  Renal/GU      Musculoskeletal   Abdominal   Peds  Hematology  (+) anemia ,   Anesthesia Other Findings   Reproductive/Obstetrics                             Anesthesia Physical Anesthesia Plan  ASA: II  Anesthesia Plan: Epidural   Post-op Pain Management:    Induction:   Airway Management Planned:   Additional Equipment:   Intra-op Plan:   Post-operative Plan:   Informed Consent: I have reviewed the patients History and Physical, chart, labs and discussed the procedure including the risks, benefits and alternatives for the proposed anesthesia with the patient or authorized representative who has indicated his/her understanding and acceptance.     Plan Discussed with: CRNA  Anesthesia Plan Comments:         Anesthesia Quick Evaluation

## 2016-10-11 NOTE — Progress Notes (Addendum)
Dr. Marcello Moores at the bedside to evaluate patient. See note. Directed to call Dr. Andree Elk (change shift) if pt. Continues to experience SOB, also notified Dr. Andree Elk when level is at T10. Pt. Currently stable.  Will repeat level check in 10 minutes

## 2016-10-11 NOTE — Progress Notes (Signed)
Subjective:  Called because patient spontaneously underwent SROM  Objective:   Vitals: Blood pressure 124/75, pulse (!) 106, temperature 97.9 F (36.6 C), temperature source Oral, resp. rate 20, height 5\' 3"  (1.6 m), weight 239 lb (108.4 kg), last menstrual period 10/23/2015. General:  Abdomen: Cervical Exam:  Dilation: 1 Effacement (%): 50 Cervical Position: Posterior Station: -3, Ballotable Exam by:: AS MD  FHT: 130, moderate, +accels, no decels Toco: q10-69min  No results found for this or any previous visit (from the past 24 hour(s)).  Assessment:   26 y.o. [id number] [redacted]w[redacted]d with PROM  Plan:   1) PROM - my recommendation would be to proceed with vaginal delivery given gestational age and EFW still below concern for shoulder dystocia, and no evidence of shoulder dystocia in prior delivery note.  I feel the risk of elective Cesarean section outweigh benefits and this would essentially be an elective primary C-section. - will keep NPO have patient consider her options, if opts for vaginal delivery and contraction pattern does not pick up will start pitocin  2) Fetus -  Cat I tracing

## 2016-10-11 NOTE — Anesthesia Procedure Notes (Signed)
Epidural Patient location during procedure: OB Start time: 10/11/2016 2:30 PM End time: 10/11/2016 2:50 PM  Staffing Anesthesiologist: Gunnar Bulla Resident/CRNA: Johnna Acosta Performed: resident/CRNA   Preanesthetic Checklist Completed: patient identified, site marked, surgical consent, pre-op evaluation, timeout performed, IV checked, risks and benefits discussed and monitors and equipment checked  Epidural Patient position: sitting Prep: Betadine Patient monitoring: heart rate, continuous pulse ox and blood pressure Approach: midline Location: L3-L4 Injection technique: LOR saline  Needle:  Needle type: Tuohy  Needle gauge: 17 G Needle length: 9 cm and 9 Needle insertion depth: 7 cm Catheter type: closed end flexible Catheter size: 19 Gauge Catheter at skin depth: 12 cm Test dose: negative and 1.5% lidocaine with Epi 1:200 K  Assessment Sensory level: T10 Events: blood not aspirated, injection not painful, no injection resistance, negative IV test and no paresthesia  Additional Notes Pt. Evaluated and documentation done after procedure finished. Patient identified. Risks/Benefits/Options discussed with patient including but not limited to bleeding, infection, nerve damage, paralysis, failed block, incomplete pain control, headache, blood pressure changes, nausea, vomiting, reactions to medication both or allergic, itching and postpartum back pain. Confirmed with bedside nurse the patient's most recent platelet count. Confirmed with patient that they are not currently taking any anticoagulation, have any bleeding history or any family history of bleeding disorders. Patient expressed understanding and wished to proceed. All questions were answered. Sterile technique was used throughout the entire procedure. Please see nursing notes for vital signs. Test dose was given through epidural catheter and negative prior to continuing to dose epidural or start infusion. Warning  signs of high block given to the patient including shortness of breath, tingling/numbness in hands, complete motor block, or any concerning symptoms with instructions to call for help. Patient was given instructions on fall risk and not to get out of bed. All questions and concerns addressed with instructions to call with any issues or inadequate analgesia.  Attempted at L4-5 with slight loss of resistance unable to thread catheter neddle advanced slightly with rush of csf noted in syringe. Needle reomed and pressure applied.  Second attempt at L 3-4  With obvious LOR catheter threaded without resistance  Negative test dose   Patient tolerated the insertion well without immediate complications.Reason for block:procedure for pain

## 2016-10-11 NOTE — Progress Notes (Signed)
Pt. with complaint of SOB, O2 sats at 100%, O2 via face mask applied with 10L, Dr. Marcello Moores notified - request to come and evaluate patient, directed to discontinue  epidural pump and check level. Level currently at T6.

## 2016-10-11 NOTE — Progress Notes (Addendum)
Dr. Andree Elk to the [facility] to assess pt., epidural level T8, MD restarted epidural pump at 7 ml. Pt. Having conversation with family members and watching movie on ipad.

## 2016-10-11 NOTE — Progress Notes (Signed)
Epidural level currently at T5, O2 remains in place, pt. States the oxygen helps (hx of anxiety - due to not able to feel legs - numb). Patient states SOB is slowly going away.  Position is SF with 02 sats at 100%. Pt. Able to wiggle toes.

## 2016-10-11 NOTE — Progress Notes (Addendum)
RN called Dr. Andree Elk to come and evaluate patient, pt. Continues to complain of SOB, O2 remains in place, epidural level at T5-T6, epidural remains off.  Dr. Andree Elk unable to come to the floor at this time - instructed to turn pump back on when patient level is at T8-T10. Dr. Glennon Mac informed of patient with SOB (currently in another delivery) - instructed to call anesthesia.

## 2016-10-12 ENCOUNTER — Encounter
Admission: EM | Disposition: A | Discharge status: Home / Self Care | Payer: Self-pay | Source: Home / Self Care | Attending: Obstetrics and Gynecology

## 2016-10-12 DIAGNOSIS — Z3A37 37 weeks gestation of pregnancy: Secondary | ICD-10-CM | POA: Diagnosis not present

## 2016-10-12 DIAGNOSIS — O4202 Full-term premature rupture of membranes, onset of labor within 24 hours of rupture: Secondary | ICD-10-CM | POA: Diagnosis present

## 2016-10-12 DIAGNOSIS — O9081 Anemia of the puerperium: Secondary | ICD-10-CM | POA: Diagnosis not present

## 2016-10-12 DIAGNOSIS — Z3493 Encounter for supervision of normal pregnancy, unspecified, third trimester: Secondary | ICD-10-CM | POA: Diagnosis present

## 2016-10-12 DIAGNOSIS — Z302 Encounter for sterilization: Secondary | ICD-10-CM | POA: Diagnosis not present

## 2016-10-12 DIAGNOSIS — Z87891 Personal history of nicotine dependence: Secondary | ICD-10-CM | POA: Diagnosis not present

## 2016-10-12 DIAGNOSIS — D649 Anemia, unspecified: Secondary | ICD-10-CM | POA: Diagnosis not present

## 2016-10-12 DIAGNOSIS — Z8632 Personal history of gestational diabetes: Secondary | ICD-10-CM | POA: Diagnosis not present

## 2016-10-12 HISTORY — PX: TUBAL LIGATION: SHX77

## 2016-10-12 LAB — RPR: RPR Ser Ql: NONREACTIVE

## 2016-10-12 SURGERY — Surgical Case
Anesthesia: Spinal

## 2016-10-12 MED ORDER — NALOXONE HCL 0.4 MG/ML IJ SOLN: 0.4000 mg | INTRAMUSCULAR | Status: DC | PRN

## 2016-10-12 MED ORDER — KETOROLAC TROMETHAMINE 30 MG/ML IJ SOLN
30.0000 mg | Freq: Four times a day (QID) | INTRAMUSCULAR | Status: AC
  Administered 2016-10-12 – 2016-10-13 (×4): 30 mg via INTRAVENOUS

## 2016-10-12 MED ORDER — KETOROLAC TROMETHAMINE 30 MG/ML IJ SOLN
30.0000 mg | Freq: Four times a day (QID) | INTRAMUSCULAR | Status: DC | PRN
  Filled 2016-10-12 (×3): qty 1

## 2016-10-12 MED ORDER — KETOROLAC TROMETHAMINE 30 MG/ML IJ SOLN: 30.0000 mg | Freq: Four times a day (QID) | INTRAMUSCULAR | Status: DC | PRN

## 2016-10-12 MED ORDER — LACTATED RINGERS IV SOLN
INTRAVENOUS | Status: DC
  Administered 2016-10-12: 10:00:00 via INTRAVENOUS

## 2016-10-12 MED ORDER — PROPOFOL 500 MG/50ML IV EMUL
INTRAVENOUS | Status: AC
  Filled 2016-10-12: qty 50

## 2016-10-12 MED ORDER — FENTANYL CITRATE (PF) 100 MCG/2ML IJ SOLN
INTRAMUSCULAR | Status: DC | PRN
  Administered 2016-10-12: 20 ug via INTRATHECAL

## 2016-10-12 MED ORDER — BUPIVACAINE IN DEXTROSE 0.75-8.25 % IT SOLN
INTRATHECAL | Status: DC | PRN
  Administered 2016-10-12: 1.2 mL via INTRATHECAL

## 2016-10-12 MED ORDER — OXYCODONE-ACETAMINOPHEN 5-325 MG PO TABS
2.0000 | ORAL_TABLET | ORAL | Status: DC | PRN
  Administered 2016-10-14 (×2): 2 via ORAL
  Filled 2016-10-12 (×3): qty 2

## 2016-10-12 MED ORDER — FENTANYL CITRATE (PF) 100 MCG/2ML IJ SOLN
INTRAMUSCULAR | Status: AC
  Filled 2016-10-12: qty 2

## 2016-10-12 MED ORDER — PHENYLEPHRINE 40 MCG/ML (10ML) SYRINGE FOR IV PUSH (FOR BLOOD PRESSURE SUPPORT)
PREFILLED_SYRINGE | INTRAVENOUS | Status: AC
  Filled 2016-10-12: qty 10

## 2016-10-12 MED ORDER — KETOROLAC TROMETHAMINE 30 MG/ML IJ SOLN
INTRAMUSCULAR | Status: AC
  Filled 2016-10-12: qty 1

## 2016-10-12 MED ORDER — MENTHOL 3 MG MT LOZG
1.0000 | LOZENGE | OROMUCOSAL | Status: DC | PRN
  Filled 2016-10-12: qty 9

## 2016-10-12 MED ORDER — EPHEDRINE 5 MG/ML INJ
INTRAVENOUS | Status: AC
  Filled 2016-10-12: qty 10

## 2016-10-12 MED ORDER — MORPHINE SULFATE (PF) 0.5 MG/ML IJ SOLN
INTRAMUSCULAR | Status: AC
  Filled 2016-10-12: qty 10

## 2016-10-12 MED ORDER — DIPHENHYDRAMINE HCL 50 MG/ML IJ SOLN
INTRAMUSCULAR | Status: AC
  Filled 2016-10-12: qty 1

## 2016-10-12 MED ORDER — DIPHENHYDRAMINE HCL 50 MG/ML IJ SOLN: 12.5000 mg | INTRAMUSCULAR | Status: DC | PRN

## 2016-10-12 MED ORDER — EPHEDRINE SULFATE 50 MG/ML IJ SOLN
INTRAMUSCULAR | Status: DC | PRN
  Administered 2016-10-12: 20 mg via INTRAVENOUS

## 2016-10-12 MED ORDER — DIPHENHYDRAMINE HCL 25 MG PO CAPS
25.0000 mg | ORAL_CAPSULE | Freq: Four times a day (QID) | ORAL | Status: DC | PRN
  Administered 2016-10-12: 25 mg via ORAL

## 2016-10-12 MED ORDER — BUPIVACAINE HCL 0.5 % IJ SOLN
10.0000 mL | Freq: Once | INTRAMUSCULAR | Status: DC
  Filled 2016-10-12: qty 10

## 2016-10-12 MED ORDER — BUPIVACAINE HCL (PF) 0.5 % IJ SOLN
INTRAMUSCULAR | Status: AC
  Filled 2016-10-12: qty 30

## 2016-10-12 MED ORDER — PHENYLEPHRINE HCL 10 MG/ML IJ SOLN
INTRAMUSCULAR | Status: DC | PRN
  Administered 2016-10-12 (×2): 80 ug via INTRAVENOUS

## 2016-10-12 MED ORDER — SUCCINYLCHOLINE CHLORIDE 200 MG/10ML IV SOSY
PREFILLED_SYRINGE | INTRAVENOUS | Status: AC
  Filled 2016-10-12: qty 10

## 2016-10-12 MED ORDER — BUPIVACAINE HCL (PF) 0.5 % IJ SOLN
INTRAMUSCULAR | Status: DC | PRN
  Administered 2016-10-12: 10 mL

## 2016-10-12 MED ORDER — NALBUPHINE HCL 10 MG/ML IJ SOLN: 5.0000 mg | Freq: Once | INTRAMUSCULAR | Status: DC | PRN

## 2016-10-12 MED ORDER — CEFAZOLIN SODIUM-DEXTROSE 2-4 GM/100ML-% IV SOLN
2.0000 g | INTRAVENOUS | Status: AC
  Administered 2016-10-12: 2 g via INTRAVENOUS
  Filled 2016-10-12: qty 100

## 2016-10-12 MED ORDER — FENTANYL CITRATE (PF) 100 MCG/2ML IJ SOLN: 25.0000 ug | INTRAMUSCULAR | Status: DC | PRN

## 2016-10-12 MED ORDER — ACETAMINOPHEN 325 MG PO TABS
650.0000 mg | ORAL_TABLET | ORAL | Status: DC | PRN
  Administered 2016-10-13: 650 mg via ORAL
  Filled 2016-10-12: qty 2

## 2016-10-12 MED ORDER — LACTATED RINGERS IV SOLN
INTRAVENOUS | Status: DC
  Administered 2016-10-13: 10:00:00 via INTRAVENOUS

## 2016-10-12 MED ORDER — ONDANSETRON HCL 4 MG/2ML IJ SOLN: 4.0000 mg | Freq: Three times a day (TID) | INTRAMUSCULAR | Status: DC | PRN

## 2016-10-12 MED ORDER — DIBUCAINE 1 % RE OINT: 1.0000 "application " | TOPICAL_OINTMENT | RECTAL | Status: DC | PRN

## 2016-10-12 MED ORDER — SODIUM CHLORIDE 0.9% FLUSH: 3.0000 mL | INTRAVENOUS | Status: DC | PRN

## 2016-10-12 MED ORDER — SENNOSIDES-DOCUSATE SODIUM 8.6-50 MG PO TABS
2.0000 | ORAL_TABLET | ORAL | Status: DC
  Administered 2016-10-13 – 2016-10-14 (×2): 2 via ORAL
  Filled 2016-10-12 (×2): qty 2

## 2016-10-12 MED ORDER — ATROPINE SULFATE 0.4 MG/ML IJ SOLN
INTRAMUSCULAR | Status: DC | PRN
  Administered 2016-10-12: .2 mg via INTRAVENOUS

## 2016-10-12 MED ORDER — SIMETHICONE 80 MG PO CHEW: 80.0000 mg | CHEWABLE_TABLET | ORAL | Status: DC | PRN

## 2016-10-12 MED ORDER — TETANUS-DIPHTH-ACELL PERTUSSIS 5-2.5-18.5 LF-MCG/0.5 IM SUSP: 0.5000 mL | Freq: Once | INTRAMUSCULAR | Status: DC

## 2016-10-12 MED ORDER — NALBUPHINE HCL 10 MG/ML IJ SOLN: 5.0000 mg | INTRAMUSCULAR | Status: DC | PRN

## 2016-10-12 MED ORDER — MORPHINE SULFATE (PF) 2 MG/ML IV SOLN: 1.0000 mg | INTRAVENOUS | Status: DC | PRN

## 2016-10-12 MED ORDER — OXYTOCIN 40 UNITS IN LACTATED RINGERS INFUSION - SIMPLE MED
2.5000 [IU]/h | INTRAVENOUS | Status: AC
  Filled 2016-10-12: qty 1000

## 2016-10-12 MED ORDER — ONDANSETRON HCL 4 MG/2ML IJ SOLN
INTRAMUSCULAR | Status: DC | PRN
  Administered 2016-10-12: 4 mg via INTRAVENOUS

## 2016-10-12 MED ORDER — COCONUT OIL OIL: 1.0000 "application " | TOPICAL_OIL | Status: DC | PRN

## 2016-10-12 MED ORDER — BUPIVACAINE 0.25 % ON-Q PUMP DUAL CATH 400 ML
400.0000 mL | INJECTION | Status: DC
  Filled 2016-10-12 (×5): qty 400

## 2016-10-12 MED ORDER — PRENATAL MULTIVITAMIN CH: 1.0000 | ORAL_TABLET | Freq: Every day | ORAL | Status: DC

## 2016-10-12 MED ORDER — OXYCODONE-ACETAMINOPHEN 5-325 MG PO TABS
1.0000 | ORAL_TABLET | ORAL | Status: DC | PRN
  Administered 2016-10-13 – 2016-10-14 (×3): 1 via ORAL
  Filled 2016-10-12 (×2): qty 1

## 2016-10-12 MED ORDER — SIMETHICONE 80 MG PO CHEW: 80.0000 mg | CHEWABLE_TABLET | ORAL | Status: DC

## 2016-10-12 MED ORDER — DIPHENHYDRAMINE HCL 50 MG/ML IJ SOLN
INTRAMUSCULAR | Status: DC | PRN
  Administered 2016-10-12: 12.5 mg via INTRAVENOUS

## 2016-10-12 MED ORDER — ONDANSETRON HCL 4 MG/2ML IJ SOLN: 4.0000 mg | Freq: Once | INTRAMUSCULAR | Status: DC | PRN

## 2016-10-12 MED ORDER — SIMETHICONE 80 MG PO CHEW
80.0000 mg | CHEWABLE_TABLET | Freq: Three times a day (TID) | ORAL | Status: DC
  Administered 2016-10-12 – 2016-10-14 (×5): 80 mg via ORAL
  Filled 2016-10-12 (×5): qty 1

## 2016-10-12 MED ORDER — SODIUM CHLORIDE 0.9 % IV BOLUS (SEPSIS)
500.0000 mL | Freq: Once | INTRAVENOUS | Status: AC
  Administered 2016-10-12: 500 mL via INTRAVENOUS

## 2016-10-12 MED ORDER — NALOXONE HCL 2 MG/2ML IJ SOSY
1.0000 ug/kg/h | PREFILLED_SYRINGE | INTRAMUSCULAR | Status: DC | PRN
  Filled 2016-10-12: qty 2

## 2016-10-12 MED ORDER — ATROPINE SULFATE 0.4 MG/ML IJ SOLN
INTRAMUSCULAR | Status: AC
  Filled 2016-10-12: qty 1

## 2016-10-12 MED ORDER — MORPHINE SULFATE (PF) 0.5 MG/ML IJ SOLN
INTRAMUSCULAR | Status: DC | PRN
  Administered 2016-10-12: .2 mg via INTRATHECAL

## 2016-10-12 MED ORDER — SOD CITRATE-CITRIC ACID 500-334 MG/5ML PO SOLN
30.0000 mL | ORAL | Status: AC
  Administered 2016-10-12: 30 mL via ORAL

## 2016-10-12 MED ORDER — ACETAMINOPHEN 500 MG PO TABS
1000.0000 mg | ORAL_TABLET | Freq: Four times a day (QID) | ORAL | Status: AC
  Administered 2016-10-12 – 2016-10-13 (×2): 1000 mg via ORAL
  Filled 2016-10-12 (×3): qty 2

## 2016-10-12 MED ORDER — ZOLPIDEM TARTRATE 5 MG PO TABS: 5.0000 mg | ORAL_TABLET | Freq: Every evening | ORAL | Status: DC | PRN

## 2016-10-12 MED ORDER — MEPERIDINE HCL 25 MG/ML IJ SOLN: 6.2500 mg | INTRAMUSCULAR | Status: DC | PRN

## 2016-10-12 MED ORDER — DIPHENHYDRAMINE HCL 25 MG PO CAPS
25.0000 mg | ORAL_CAPSULE | ORAL | Status: DC | PRN
  Filled 2016-10-12: qty 1

## 2016-10-12 MED ORDER — PROPOFOL 10 MG/ML IV BOLUS
INTRAVENOUS | Status: AC
  Filled 2016-10-12: qty 20

## 2016-10-12 MED ORDER — WITCH HAZEL-GLYCERIN EX PADS: 1.0000 "application " | MEDICATED_PAD | CUTANEOUS | Status: DC | PRN

## 2016-10-12 MED ORDER — BUPIVACAINE 0.5 % ON-Q PUMP SINGLE CATH 400 ML
400.0000 mL | INJECTION | Status: DC
  Filled 2016-10-12: qty 400

## 2016-10-12 SURGICAL SUPPLY — 26 items
CANISTER SUCT 3000ML (MISCELLANEOUS) ×2 IMPLANT
CATH KIT ON-Q SILVERSOAK 5IN (CATHETERS) ×4 IMPLANT
CHLORAPREP W/TINT 26ML (MISCELLANEOUS) ×4 IMPLANT
DERMABOND ADVANCED (GAUZE/BANDAGES/DRESSINGS) ×1
DERMABOND ADVANCED .7 DNX12 (GAUZE/BANDAGES/DRESSINGS) ×1 IMPLANT
DRSG OPSITE POSTOP 4X10 (GAUZE/BANDAGES/DRESSINGS) ×2 IMPLANT
ELECT CAUTERY BLADE 6.4 (BLADE) ×2 IMPLANT
ELECT REM PT RETURN 9FT ADLT (ELECTROSURGICAL) ×2
ELECTRODE REM PT RTRN 9FT ADLT (ELECTROSURGICAL) ×1 IMPLANT
GLOVE SKINSENSE NS SZ8.0 LF (GLOVE) ×5
GLOVE SKINSENSE STRL SZ8.0 LF (GLOVE) ×5 IMPLANT
GOWN STRL REUS W/ TWL LRG LVL3 (GOWN DISPOSABLE) ×1 IMPLANT
GOWN STRL REUS W/ TWL XL LVL3 (GOWN DISPOSABLE) ×2 IMPLANT
GOWN STRL REUS W/TWL LRG LVL3 (GOWN DISPOSABLE) ×1
GOWN STRL REUS W/TWL XL LVL3 (GOWN DISPOSABLE) ×2
LIQUID BAND (GAUZE/BANDAGES/DRESSINGS) ×2 IMPLANT
NS IRRIG 1000ML POUR BTL (IV SOLUTION) ×2 IMPLANT
PACK C SECTION AR (MISCELLANEOUS) ×2 IMPLANT
PAD OB MATERNITY 4.3X12.25 (PERSONAL CARE ITEMS) ×2 IMPLANT
PAD PREP 24X41 OB/GYN DISP (PERSONAL CARE ITEMS) ×2 IMPLANT
RETRACTOR TRAXI PANNICULUS (MISCELLANEOUS) ×1 IMPLANT
SUT MAXON ABS #0 GS21 30IN (SUTURE) ×4 IMPLANT
SUT VIC AB 1 CT1 36 (SUTURE) ×6 IMPLANT
SUT VIC AB 2-0 CT1 36 (SUTURE) ×2 IMPLANT
SUT VIC AB 4-0 FS2 27 (SUTURE) ×2 IMPLANT
TRAXI PANNICULUS RETRACTOR (MISCELLANEOUS) ×1

## 2016-10-12 NOTE — H&P (Signed)
  Labor Progress Note   26 y.o. [id number] @ [redacted]w[redacted]d , admitted for  Pregnancy, Labor Management. Protracted labor.  Subjective:  No pain, epidural in place, but does feel reg ctxs  Objective:  BP 122/63   Pulse 60   Temp 98 F (36.7 C) (Oral)   Resp 16   Ht 5\' 3"  (1.6 m)   Wt 108.4 kg (239 lb)   LMP 10/23/2015 Comment: every 3 months - BCP; preg test neg  SpO2 100%   BMI 42.34 kg/m  Abd: mild Extr: trace to 1+ bilateral pedal edema SVE: CERVIX: unchanged, still 1cm/20%/high station  EFM: FHR: 150 bpm, variability: moderate,  accelerations:  Present,  decelerations:  Absent Toco: Frequency: Every 3-5 minutes Labs: I have reviewed the patient's lab results.   Assessment & Plan:  [id number] @ [redacted]w[redacted]d, admitted for  Pregnancy and Labor/Delivery Management  1. Pain management: epidural. 2. FWB: FHT category 1.  3. ID: GBS unknown, PROM > 24 hours on Ancef 4. Labor management: Protracted labor, Prolonged PROM, Possible Macrosomia, Desire for sterility.  Pt has been rupture for >24 hours with no progress on cervical dilation despite Cytotec and Pitocin.  Pro and con of continued mgt of labor w induction agents vs CS discussed; history of prolonged labor with last pregnancy taken into consideration as well.  Pt desires CS at this time, also BTL.  The risks of cesarean section discussed with the patient included but were not limited to: bleeding which may require transfusion or reoperation; infection which may require antibiotics; injury to bowel, bladder, ureters or other surrounding organs; injury to the fetus; need for additional procedures including hysterectomy in the event of a life-threatening hemorrhage; placental abnormalities wth subsequent pregnancies, incisional problems, thromboembolic phenomenon and other postoperative/anesthesia complications. The patient concurred with the proposed plan, giving informed written consent for the procedure.    All discussed with patient, see  orders

## 2016-10-12 NOTE — Transfer of Care (Signed)
Immediate Anesthesia Transfer of Care Note  Patient: Julie Estrada  Procedure(s) Performed: Procedure(s): CESAREAN SECTION with bilateral tubal ligation (N/A)  Patient Location: PACU  Anesthesia Type:Spinal  Level of Consciousness: awake, alert  and oriented  Airway & Oxygen Therapy: Patient Spontanous Breathing  Post-op Assessment: Post -op Vital signs reviewed and stable  Post vital signs: stable  Last Vitals:  Vitals:   10/12/16 0738 10/12/16 1211  BP: 122/63 (!) 93/58  Pulse: 60 81  Resp: 16 15  Temp: 36.7 C 36.8 C    Last Pain:  Vitals:   10/12/16 1211  TempSrc: Oral  PainSc:          Complications: No apparent anesthesia complications

## 2016-10-12 NOTE — Op Note (Signed)
Cesarean Section Procedure Note Indications: Premature prolonged rupture of membranes, failure to progress: arrest of dilation and term intrauterine pregnancy, Desire for permanent sterility  Pre-operative Diagnosis: Intrauterine pregnancy [redacted]w[redacted]d with premature prolonged rupture of membranes;  failure to progress: arrest of dilation and term intrauterine pregnancy, Desire for permanent sterility Post-operative Diagnosis: same, delivered. Procedure: Low Transverse Cesarean Section, Bilateral Tubal Ligation Surgeon: Barnett Applebaum, MD Anesthesia: Spinal anesthesia Estimated Blood [id number] Complications: None; patient tolerated the procedure well. Disposition: PACU - hemodynamically stable. Condition: stable  Findings: A female infant in the cephalic presentation.  [place]. Amniotic fluid - Clear  Birth weight 4180 g.  Apgars of 8 and 8.  Intact placenta with a three-vessel cord. Grossly normal uterus, tubes and ovaries bilaterally. no intraabdominal adhesions were noted.  Procedure Details   The patient was taken to Operating Room, identified as the correct patient and the procedure verified as C-Section Delivery. A Time Out was held and the above information confirmed. After induction of anesthesia, the patient was draped and prepped in the usual sterile manner. A Pfannenstiel incision was made and carried down through the subcutaneous tissue to the fascia. Fascial incision was made and extended transversely with the Mayo scissors. The fascia was separated from the underlying rectus tissue superiorly and inferiorly. The peritoneum was identified and entered bluntly. Peritoneal incision was extended longitudinally. The utero-vesical peritoneal reflection was incised transversely and a bladder flap was created digitally.  A low transverse hysterotomy was made. The fetus was delivered atraumatically. The umbilical cord was clamped x2 and cut and the infant was handed to the awaiting pediatricians.  The placenta was removed intact and appeared normal with a 3-vessel cord.  The uterus was exteriorized and cleared of all clot and debris. The hysterotomy was closed with running sutures of 0 Vicryl suture. A second imbricating layer was placed with the same suture. Excellent hemostasis was observed.   The left Fallopian tube was identified, grasped with the Babcock clamps, lifted to the skin incision and followed out distally to the fimbriae. An avascular midsection of the tube approximately 3-4cm from the cornua was grasped with the babcock clamps and brought into a knuckle at the skin incision. The tube was double ligated with 2-0 Vicryl suture and the intervening portion of tube was transected and removed. Excellent hemostasis was noted and the tube was returned to the abdomen. Attention was then turned to the right fallopian tube after confirmation of identification by tracing the tube out to the fimbriae. The same procedure was then performed on the right Fallopian tube. Again, excellent hemostasis was noted at the end of the procedure.  The uterus was returned to the abdomen. The pelvis was irrigated and again, excellent hemostasis was noted.  The On Q Pain pump System was then placed.  Trocars were placed through the abdominal wall into the subfascial space and these were used to thread the silver soaker cathaters into place.The rectus fascia was then reapproximated with running sutures of Maxon, with careful placement not to incorporate the cathaters. Subcutaneous tissues are then irrigated with saline and hemostasis assured.  Skin is then closed with 4-0 vicryl suture in a subcuticular fashion followed by skin adhesive. The cathaters are flushed each with 5 mL of Bupivicaine and stabilized into place with dressing. Instrument, sponge, and needle counts were correct prior to the abdominal closure and at the conclusion of the case.  The patient tolerated the procedure well and was transferred to the  recovery room in stable condition.

## 2016-10-12 NOTE — Anesthesia Procedure Notes (Signed)
Spinal  Patient location during procedure: OR Staffing Anesthesiologist: Molli Barrows Performed: anesthesiologist  Preanesthetic Checklist Completed: patient identified, site marked, surgical consent, pre-op evaluation, timeout performed, IV checked, risks and benefits discussed and monitors and equipment checked Spinal Block Patient position: sitting Prep: Betadine Patient monitoring: heart rate, continuous pulse ox, blood pressure and cardiac monitor Approach: midline Location: L3-4 Injection technique: single-shot Needle Needle type: Whitacre and Introducer  Needle gauge: 27 G Needle length: 9 cm Assessment Sensory level: T2 Additional Notes Negative paresthesia. Negative blood return. Positive free-flowing CSF. Expiration date of kit checked and confirmed. Patient tolerated procedure well, without complications.

## 2016-10-12 NOTE — Discharge Summary (Signed)
Obstetrical Discharge Summary  Date of Admission: 10/10/2016 Date of Discharge: 10/14/2016  Discharge Diagnosis: Term Pregnancy-delivered Primary OB:  [facility]   Gestational Age at Delivery: [redacted]w[redacted]d  Antepartum complications: prolonged rupture of membranes Date of Delivery: 10/12/2016  Delivered By: Barnett Applebaum, MD Delivery Type: primary cesarean section, low transverse incision Intrapartum complications/course: Failure to Progress and PROM Anesthesia: spinal Placenta: manual removal Laceration: n/a Episiotomy: none Live born female  Birth Weight: 9 lb 3.4 oz (4180 g) APGAR: 8, 8   Post partum course: Since the delivery, patient has tolerate activity, diet, and daily functions without difficulty. Her hemoglobin dropped to 6.6 postpartum Day 1 and she received 1 Unit PRBC transfusion. Pt denies feeling light headed or dizzy. She has been able to ambulate on her own since yesterday. She has taken a shower since her transfusion. She has had 2 bowel movements since delivery. Min lochia.  No breast concerns at this time.  No signs of depression currently.   Labs:   Results for NOELI, NAGORSKI (MRN [id number]) as of 10/14/2016 10:59  Ref. Range 10/13/2016 18:54 10/14/2016 06:59  WBC Latest Ref Range: 3.6 - 11.0 K/uL 7.7   RBC Latest Ref Range: 3.80 - 5.20 MIL/uL 2.97 (L)   Hemoglobin Latest Ref Range: 12.0 - 16.0 g/dL 7.6 (L) 7.0 (L)  HCT Latest Ref Range: 35.0 - 47.0 % 22.7 (L)   MCV Latest Ref Range: 80.0 - 100.0 fL 76.5 (L)   MCH Latest Ref Range: 26.0 - 34.0 pg 25.6 (L)   MCHC Latest Ref Range: 32.0 - 36.0 g/dL 33.4   RDW Latest Ref Range: 11.5 - 14.5 % 15.8 (H)   Platelets Latest Ref Range: 150 - 440 K/uL 195     Postpartum Exam:General appearance: alert, cooperative, appears stated age and no distress GI: soft, non-tender; bowel sounds normal; no masses,  no organomegaly, Fundus firm and incision site c/d/i with waffle dressing intact. OnQ pump in place Extremities: Homans sign is  negative, no sign of DVT Breast: soft, non tender  Disposition: home with infant Rh Immune globulin given: not applicable Rubella vaccine given: not applicable Varicella vaccine given: not applicable Tdap vaccine given in AP or PP setting: given during prenatal care Flu vaccine given in AP or PP setting: given during prenatal care Contraception: bilateral tubal ligation  Prenatal Labs: A POS//Rubella Immune//RPR negative//HIV negative/HepB Surface Ag negative  Plan:  Jamie Kato Kato was discharged to home in good condition. Follow-up appointment with Dr Kenton Kingfisher at [facility] in 1 week for incision check  Discharge Medications: Allergies as of 10/14/2016      Reactions   Iodine Anaphylaxis   Shrimp [shellfish Allergy] Anaphylaxis   Penicillins Nausea And Vomiting      Medication List    STOP taking these medications   ondansetron 8 MG disintegrating tablet Commonly known as:  ZOFRAN ODT     TAKE these medications   ferrous sulfate 325 (65 FE) MG tablet Commonly known as:  FERROUSUL Take 1 tablet (325 mg total) by mouth daily with breakfast.   Influenza vac split quadrivalent PF 0.5 ML injection Commonly known as:  FLUARIX Inject 0.5 mLs into the muscle tomorrow at 10 am.   oxyCODONE-acetaminophen 5-325 MG tablet Commonly known as:  PERCOCET/ROXICET Take 1 tablet by mouth every 4 (four) hours as needed for severe pain (pain scale 7-10).   senna-docusate 8.6-50 MG tablet Commonly known as:  Senokot-S Take 2 tablets by mouth daily.       Rod Can,  CNM

## 2016-10-12 NOTE — Progress Notes (Signed)
Pt's output reported to Dr. Kenton Kingfisher. 500cc NS Bolus ordered. Pt. Is alert and oriented with cheerful affect. VSS. Color good, skin w&d. BBS clear. Cap. Refill < 2 sec. Will administer IVF as ordered.

## 2016-10-12 NOTE — Plan of Care (Signed)
Problem: Respiratory: Goal: Ability to maintain adequate ventilation will improve Outcome: Progressing Instructed in TCDB and Incentive Spirometer. Demonstrates understanding via Teach Back  Problem: Skin Integrity: Goal: Demonstration of wound healing without infection will improve Outcome: Progressing Honeycomb dressing in place and D&I  Problem: Urinary Elimination: Goal: Ability to reestablish a normal urinary elimination pattern will improve Outcome: Not Progressing Indwelling Cathter in place Pot Op  Problem: Pain Managment: Goal: General experience of comfort will improve Outcome: Progressing States she is comfortable with On-Q pump and scheduled Ibuprofen and Tylenol.

## 2016-10-12 NOTE — Anesthesia Post-op Follow-up Note (Cosign Needed)
Anesthesia QCDR form completed.        

## 2016-10-13 LAB — CBC
HCT: 20.3 % — ABNORMAL LOW (ref 35.0–47.0)
HCT: 22.7 % — ABNORMAL LOW (ref 35.0–47.0)
HEMOGLOBIN: 6.8 g/dL — AB (ref 12.0–16.0)
Hemoglobin: 7.6 g/dL — ABNORMAL LOW (ref 12.0–16.0)
MCH: 25.1 pg — ABNORMAL LOW (ref 26.0–34.0)
MCH: 25.6 pg — ABNORMAL LOW (ref 26.0–34.0)
MCHC: 33.4 g/dL (ref 32.0–36.0)
MCHC: 33.4 g/dL (ref 32.0–36.0)
MCV: 75.1 fL — ABNORMAL LOW (ref 80.0–100.0)
MCV: 76.5 fL — ABNORMAL LOW (ref 80.0–100.0)
Platelets: 150 10*3/uL (ref 150–440)
Platelets: 195 K/uL (ref 150–440)
RBC: 2.7 MIL/uL — AB (ref 3.80–5.20)
RBC: 2.97 MIL/uL — ABNORMAL LOW (ref 3.80–5.20)
RDW: 15.8 % — ABNORMAL HIGH (ref 11.5–14.5)
RDW: 15.8 % — ABNORMAL HIGH (ref 11.5–14.5)
WBC: 7.2 10*3/uL (ref 3.6–11.0)
WBC: 7.7 K/uL (ref 3.6–11.0)

## 2016-10-13 LAB — PREPARE RBC (CROSSMATCH)

## 2016-10-13 MED ORDER — DIPHENHYDRAMINE HCL 25 MG PO CAPS
25.0000 mg | ORAL_CAPSULE | Freq: Once | ORAL | Status: AC
  Administered 2016-10-13: 25 mg via ORAL
  Filled 2016-10-13: qty 1

## 2016-10-13 MED ORDER — INFLUENZA VAC SPLIT QUAD 0.5 ML IM SUSY
0.5000 mL | PREFILLED_SYRINGE | INTRAMUSCULAR | Status: AC
Start: 2016-10-14 — End: 2016-10-14
  Administered 2016-10-14: 0.5 mL via INTRAMUSCULAR
  Filled 2016-10-13: qty 0.5

## 2016-10-13 MED ORDER — IBUPROFEN 400 MG PO TABS
400.0000 mg | ORAL_TABLET | Freq: Four times a day (QID) | ORAL | Status: DC | PRN
  Administered 2016-10-13 – 2016-10-14 (×4): 400 mg via ORAL
  Filled 2016-10-13 (×4): qty 1

## 2016-10-13 MED ORDER — FUROSEMIDE 10 MG/ML IJ SOLN
20.0000 mg | Freq: Once | INTRAMUSCULAR | Status: AC
  Administered 2016-10-13: 20 mg via INTRAVENOUS
  Filled 2016-10-13: qty 2

## 2016-10-13 MED ORDER — ACETAMINOPHEN 325 MG PO TABS
650.0000 mg | ORAL_TABLET | Freq: Once | ORAL | Status: AC
  Administered 2016-10-13: 650 mg via ORAL
  Filled 2016-10-13: qty 2

## 2016-10-13 MED ORDER — SODIUM CHLORIDE 0.9 % IV SOLN
Freq: Once | INTRAVENOUS | Status: AC
  Administered 2016-10-13: 11:00:00 via INTRAVENOUS

## 2016-10-13 NOTE — Progress Notes (Signed)
Admit Date: 10/10/2016 Today's Date: 10/13/2016  Subjective: Postpartum Day 1: Cesarean Delivery Patient reports incisional pain.  Weakness.   Objective: Vital signs in last 24 hours: Temp:  [97.6 F (36.4 C)-98.3 F (36.8 C)] 98.1 F (36.7 C) (01/21 0827) Pulse Rate:  [57-91] 74 (01/21 0827) Resp:  [5-26] 18 (01/21 0827) BP: (72-129)/(36-78) 108/63 (01/21 0827) SpO2:  [93 %-100 %] 99 % (01/21 0827) Low UOP overnight, one bolus of IVF given.  Physical Exam:  General: alert, cooperative and no distress Lochia: appropriate Uterine Fundus: firm Incision: healing well DVT Evaluation: No evidence of DVT seen on physical exam.   Recent Labs  10/11/16 0703 10/13/16 0609  HGB 10.3* 6.8*  HCT 31.1* 20.3*    Assessment/Plan: Status post Cesarean section. Postoperative course complicated by Anemia with significant drop in Hgb along with low UOP and weakness/fatigue.  VSS.  Discussed pros and cons of blood transfusion, will plan 1 U pRBCs.  Bottle feeding. S/p BTL.  Hoyt Koch 10/13/2016, 8:52 AM

## 2016-10-13 NOTE — Progress Notes (Signed)
300cc U/O since 500cc NS Bolus. Pt. Has been sleeping and has not had in as much P.O. She is alert and oriented with cheerfull affect. Color is sl. Pale. Cap. Refill is < 3 sec. Denies C/O. Appears comfortable and in NAD.

## 2016-10-14 ENCOUNTER — Encounter: Payer: Self-pay | Admitting: Obstetrics & Gynecology

## 2016-10-14 LAB — TYPE AND SCREEN
BLOOD PRODUCT EXPIRATION DATE: 201801232359
ISSUE DATE / TIME: 201801211146
Unit Type and Rh: 6200

## 2016-10-14 LAB — HEMOGLOBIN: Hemoglobin: 7 g/dL — ABNORMAL LOW (ref 12.0–16.0)

## 2016-10-14 MED ORDER — OXYCODONE-ACETAMINOPHEN 5-325 MG PO TABS: 1.0000 | ORAL_TABLET | ORAL | 0 refills | Status: DC | PRN

## 2016-10-14 MED ORDER — INFLUENZA VAC SPLIT QUAD 0.5 ML IM SUSY: 0.5000 mL | PREFILLED_SYRINGE | INTRAMUSCULAR | 0 refills | Status: AC

## 2016-10-14 MED ORDER — FERROUS SULFATE 325 (65 FE) MG PO TABS: 325.0000 mg | ORAL_TABLET | Freq: Every day | ORAL | 3 refills | Status: DC

## 2016-10-14 MED ORDER — SENNOSIDES-DOCUSATE SODIUM 8.6-50 MG PO TABS: 2.0000 | ORAL_TABLET | ORAL | 2 refills | Status: DC

## 2016-10-14 MED FILL — Bupivacaine HCl Inj 0.25%: Qty: 400 | Status: AC

## 2016-10-14 NOTE — Discharge Instructions (Signed)
Please call your doctor or return to the ER if you experience any chest pains, shortness of breath, fever greater than 101, any heavy bleeding or large clots, and foul smelling vaginal discharge, any worsening abdominal pain & cramping that is not controlled by pain medication, or any signs of post partum depression. Check your incision daily for redness, tenderness or drainage.  No tampons, enemas, douches, or sexual intercourse for 6 weeks.  Also avoid tub baths, hot tubs, or swimming for 6 weeks.  Cesarean Delivery, Care After Refer to this sheet in the next few weeks. These instructions provide you with information about caring for yourself after your procedure. Your health care provider may also give you more specific instructions. Your treatment has been planned according to current medical practices, but problems sometimes occur. Call your health care provider if you have any problems or questions after your procedure. What can I expect after the procedure? After the procedure, it is common to have:  A small amount of blood or clear fluid coming from the incision.  Some redness, swelling, and pain in your incision area.  Some abdominal pain and soreness.  Vaginal bleeding (lochia).  Pelvic cramps.  Fatigue. Follow these instructions at home: Incision care  Follow instructions from your health care provider about how to take care of your incision. Make sure you:  Wash your hands with soap and water before you change your bandage (dressing). If soap and water are not available, use hand sanitizer.  Change your dressing as told by your health care provider.  Leave stitches (sutures), skin staples, skin glue, or adhesive strips in place. These skin closures may need to stay in place for 2 weeks or longer. If adhesive strip edges start to loosen and curl up, you may trim the loose edges. Do not remove adhesive strips completely unless your health care provider tells you to do  that.  Check your incision area every day for signs of infection. Check for:  More redness, swelling, or pain.  More fluid or blood.  Warmth.  Pus or a bad smell.  When you cough or sneeze, hug a pillow. This helps with pain and decreases the chance of your incision opening up (dehiscing). Do this until your incision heals. Medicines  Take over-the-counter and prescription medicines only as told by your health care provider.  If you were prescribed an antibiotic medicine, take it as told by your health care provider. Do not stop taking the antibiotic until it is finished. Driving  Do not drive or operate heavy machinery while taking prescription pain medicine.  Do not drive for 24 hours if you received a sedative. Lifestyle  Do not drink alcohol. This is especially important if you are breastfeeding or taking pain medicine.  Do not use tobacco products, including cigarettes, chewing tobacco, or e-cigarettes. If you need help quitting, ask your health care provider. Tobacco can delay wound healing. Eating and drinking  Drink at least 8 eight-ounce glasses of water every day unless told not to by your health care provider. If you breastfeed, you may need to drink more water than this.  Eat high-fiber foods every day. These foods may help prevent or relieve constipation. High-fiber foods include:  Whole grain cereals and breads.  Brown rice.  Beans.  Fresh fruits and vegetables. Activity  Return to your normal activities as told by your health care provider. Ask your health care provider what activities are safe for you.  Rest as much as possible. Try  to rest or take a nap while your baby is sleeping.  Do not lift anything that is heavier than your baby or 10 lb (4.5 kg) as told by your health care provider.  Ask your health care provider when you can engage in sexual activity. This may depend on your:  Risk of infection.  Healing rate.  Comfort and desire to  engage in sexual activity. Bathing  Do not take baths, swim, or use a hot tub until your health care provider approves. Ask your health care provider if you can take showers. You may only be allowed to take sponge baths until your incision heals.  Keep your dressing dry as told by your health care provider. General instructions  Do not use tampons or douches until your health care provider approves.  Wear:  Loose, comfortable clothing.  A supportive and well-fitting bra.  Watch for any blood clots that may pass from your vagina. These may look like clumps of dark red, brown, or black discharge.  Keep your perineum clean and dry as told by your health care provider.  Wipe from front to back when you use the toilet.  If possible, have someone help you care for your baby and help with household activities for a few days after you leave the hospital.  Keep all follow-up visits for you and your baby as told by your health care provider. This is important. Contact a health care provider if:  You have:  Bad-smelling vaginal discharge.  Difficulty urinating.  Pain when urinating.  A sudden increase or decrease in the frequency of your bowel movements.  More redness, swelling, or pain around your incision.  More fluid or blood coming from your incision.  Pus or a bad smell coming from your incision.  A fever.  A rash.  Little or no interest in activities you used to enjoy.  Questions about caring for yourself or your baby.  Nausea.  Your incision feels warm to the touch.  Your breasts turn red or become painful or hard.  You feel unusually sad or worried.  You vomit.  You pass large blood clots from your vagina. If you pass a blood clot, save it to show to your health care provider. Do not flush blood clots down the toilet without showing your health care provider.  You urinate more than usual.  You are dizzy or light-headed.  You have not breastfed and have  not had a menstrual period for 12 weeks after delivery.  You stopped breastfeeding and have not had a menstrual period for 12 weeks after stopping breastfeeding. Get help right away if:  You have:  Pain that does not go away or get better with medicine.  Chest pain.  Difficulty breathing.  Blurred vision or spots in your vision.  Thoughts about hurting yourself or your baby.  New pain in your abdomen or in one of your legs.  A severe headache.  You faint.  You bleed from your vagina so much that you fill two sanitary pads in one hour. This information is not intended to replace advice given to you by your health care provider. Make sure you discuss any questions you have with your health care provider. Document Released: 06/01/2002 Document Revised: 01/18/2016 Document Reviewed: 08/14/2015 Elsevier Interactive Patient Education  2017 [employer].

## 2016-10-14 NOTE — Anesthesia Postprocedure Evaluation (Signed)
Anesthesia Post Note  Patient: [name]  Procedure(s) Performed: Procedure(s) (LRB): CESAREAN SECTION with bilateral tubal ligation (N/A)  Patient location during evaluation: Mother Baby Anesthesia Type: Spinal Level of consciousness: awake and alert and oriented Pain management: pain level controlled Vital Signs Assessment: post-procedure vital signs reviewed and stable Respiratory status: spontaneous breathing Cardiovascular status: stable Postop Assessment: adequate PO intake, no signs of nausea or vomiting and no headache Anesthetic complications: no Comments: Epidural site bruised     Last Vitals:  Vitals:   10/13/16 2331 10/14/16 0412  BP: 123/70   Pulse: 83   Resp: 20   Temp: 36.3 C 36.6 C    Last Pain:  Vitals:   10/14/16 0633  TempSrc:   PainSc: 5                  Kemani Heidel,  Clearnce Sorrel

## 2016-10-14 NOTE — Progress Notes (Signed)
D/C order from MD.  Reviewed d/c instructions and prescriptions with patient and answered any questions.  Patient d/c home with infant via wheelchair by nursing/auxillary. 

## 2016-10-14 NOTE — Anesthesia Post-op Follow-up Note (Signed)
  Anesthesia Pain Follow-up Note  Patient: Julie Estrada  Day #: 1  Date of Follow-up: 10/14/2016 Time: 7:17 AM  Last Vitals:  Vitals:   10/13/16 2331 10/14/16 0412  BP: 123/70   Pulse: 83   Resp: 20   Temp: 36.3 C 36.6 C    Level of Consciousness: alert  Pain: mild   Side Effects:None  Catheter Site Exam:clean, dry     Plan: D/C from anesthesia care at surgeon's request  Estill Batten

## 2016-10-15 ENCOUNTER — Encounter: Payer: Self-pay | Admitting: Emergency Medicine

## 2016-10-15 ENCOUNTER — Emergency Department
Admission: EM | Admit: 2016-10-15 | Discharge: 2016-10-16 | Disposition: A | Discharge status: Home / Self Care | Payer: 59 | Attending: Emergency Medicine | Admitting: Emergency Medicine

## 2016-10-15 DIAGNOSIS — R51 Headache: Secondary | ICD-10-CM | POA: Diagnosis present

## 2016-10-15 DIAGNOSIS — Z79899 Other long term (current) drug therapy: Secondary | ICD-10-CM | POA: Insufficient documentation

## 2016-10-15 DIAGNOSIS — Z87891 Personal history of nicotine dependence: Secondary | ICD-10-CM | POA: Insufficient documentation

## 2016-10-15 DIAGNOSIS — G971 Other reaction to spinal and lumbar puncture: Secondary | ICD-10-CM | POA: Insufficient documentation

## 2016-10-15 LAB — SURGICAL PATHOLOGY

## 2016-10-15 NOTE — ED Triage Notes (Addendum)
Patient ambulatory to triage with steady gait, without difficulty or distress noted; pt reports c-section on Saturday; received and epidural and then a spinal; Sunday developed HA which has been unrelieved with tylenol, ibuprofen, perocet and aleve; st her OB doctor was aware of such prior to d/c but informed her to come to ED for evaluation tonight; pt denies any accomp symptoms & denies any complications during pregnancy and that her BP was "low during such"; d/c from hosp yest

## 2016-10-16 DIAGNOSIS — G971 Other reaction to spinal and lumbar puncture: Secondary | ICD-10-CM | POA: Diagnosis not present

## 2016-10-16 MED ORDER — ONDANSETRON HCL 4 MG/2ML IJ SOLN
4.0000 mg | Freq: Once | INTRAMUSCULAR | Status: AC
  Administered 2016-10-16: 4 mg via INTRAVENOUS
  Filled 2016-10-16: qty 2

## 2016-10-16 MED ORDER — MORPHINE SULFATE (PF) 4 MG/ML IV SOLN
4.0000 mg | Freq: Once | INTRAVENOUS | Status: AC
  Administered 2016-10-16: 4 mg via INTRAVENOUS
  Filled 2016-10-16: qty 1
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
## 2016-10-17 ENCOUNTER — Encounter: Payer: Self-pay | Admitting: Anesthesiology

## 2016-10-17 ENCOUNTER — Ambulatory Visit: Payer: 59 | Attending: Obstetrics & Gynecology

## 2016-10-17 DIAGNOSIS — R51 Headache: Secondary | ICD-10-CM | POA: Insufficient documentation
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
## 2016-10-17 NOTE — Anesthesia Procedure Notes (Signed)
Epidural Blood Patch Patient location during procedure: OB Start time: 10/17/2016 10:15 AM End time: 10/17/2016 10:26 AM  Staffing Anesthesiologist: Martha Clan Performed: anesthesiologist   Preanesthetic Checklist Completed: patient identified, site marked, surgical consent, pre-op evaluation, timeout performed, IV checked, risks and benefits discussed and monitors and equipment checked  Epidural Patient position: sitting Prep: ChloraPrep Patient monitoring: heart rate, continuous pulse ox and blood pressure Approach: midline Location: L4-L5 Injection technique: LOR air  Needle:  Needle type: Tuohy  Needle gauge: 17 G Needle length: 9 cm and 9 Needle insertion depth: 6 cm  Assessment Events: blood not aspirated, injection not painful, no injection resistance, negative IV test and no paresthesia  Additional Notes Pt. Evaluated and documentation done after procedure finished. Patient identified. Risks/Benefits/Options discussed with patient including but not limited to bleeding, infection, nerve damage, paralysis, incomplete pain control, headache, and back pain. Confirmed with bedside nurse the patient's most recent platelet count. Confirmed with patient that they are not currently taking any anticoagulation, have any bleeding history or any family history of bleeding disorders. Patient expressed understanding and wished to proceed. All questions were answered. Sterile technique was used throughout the entire procedure including a sterile blood draw from the patient's right AC. 29 mL of blood were injected into the epidural space with only minimal pain/pressure felt by the patient.  Patient did note increased pressure in her back upon lying flat.  Plan to lay flat for 30-45 minutes with plans to sit up/walk around thereafter to check to see if the blood patch worked.  Patient did note almost immediate relief of her spinal headache after approximately 20 ml of blood were injected.  All  questions and concerns addressed with instructions to call with any issues.   Patient tolerated the procedure well without immediate complications.Reason for block:procedure for pain

## 2016-10-17 NOTE — Transfer of Care (Signed)
Immediate Anesthesia Transfer of Care Note  Patient: Julie Estrada  Procedure(s) Performed: * No procedures listed *  Patient Location: PACU  Anesthesia Type:Epidural blood patch  Level of Consciousness: awake, alert  and oriented  Airway & Oxygen Therapy: Patient Spontanous Breathing  Post-op Assessment: Report given to RN and Post -op Vital signs reviewed and stable  Post vital signs: Reviewed and stable  Last Vitals:  Vitals:   10/17/16 1021 10/17/16 1026  BP: 133/88 127/86  Pulse: 62 70  Resp: 14 16    Last Pain: There were no vitals filed for this visit.       Complications: No apparent anesthesia complications

## 2016-10-18 NOTE — Anesthesia Postprocedure Evaluation (Signed)
Anesthesia Post Note  Patient: Julie Estrada  Procedure(s) Performed: * No procedures listed *  Patient location during evaluation: PACU Anesthesia Type: Epidural Level of consciousness: awake and alert Pain management: pain level controlled Vital Signs Assessment: post-procedure vital signs reviewed and stable Respiratory status: spontaneous breathing, nonlabored ventilation and respiratory function stable Cardiovascular status: stable Postop Assessment: no headache, no backache and epidural receding Anesthetic complications: no Comments: Patient is s/p epidural blood patch with resolution of headache.  Does note some back pressure, but complete neurologic function.  No complications.     Last Vitals:  Vitals:   10/17/16 1126 10/17/16 1141  BP: 124/78 122/70  Pulse: 62 64  Resp: 18 18    Last Pain:  Vitals:   10/17/16 1141  PainSc: 4                  Martha Clan

## 2016-11-27 ENCOUNTER — Ambulatory Visit: Payer: 59 | Admitting: Obstetrics & Gynecology

## 2016-12-29 ENCOUNTER — Emergency Department
Admission: EM | Admit: 2016-12-29 | Discharge: 2016-12-29 | Disposition: A | Discharge status: Home / Self Care | Payer: 59 | Attending: Emergency Medicine | Admitting: Emergency Medicine

## 2016-12-29 ENCOUNTER — Encounter: Payer: Self-pay | Admitting: Emergency Medicine

## 2016-12-29 ENCOUNTER — Emergency Department: Payer: 59

## 2016-12-29 DIAGNOSIS — N938 Other specified abnormal uterine and vaginal bleeding: Secondary | ICD-10-CM | POA: Insufficient documentation

## 2016-12-29 DIAGNOSIS — Z79899 Other long term (current) drug therapy: Secondary | ICD-10-CM | POA: Diagnosis not present

## 2016-12-29 DIAGNOSIS — Z87891 Personal history of nicotine dependence: Secondary | ICD-10-CM | POA: Insufficient documentation

## 2016-12-29 DIAGNOSIS — R102 Pelvic and perineal pain: Secondary | ICD-10-CM | POA: Diagnosis present

## 2016-12-29 LAB — URINALYSIS, COMPLETE (UACMP) WITH MICROSCOPIC
Bacteria, UA: NONE SEEN
Bilirubin Urine: NEGATIVE
GLUCOSE, UA: NEGATIVE mg/dL
KETONES UR: NEGATIVE mg/dL
LEUKOCYTES UA: NEGATIVE
NITRITE: NEGATIVE
PH: 5 (ref 5.0–8.0)
Protein, ur: NEGATIVE mg/dL
SPECIFIC GRAVITY, URINE: 1.024 (ref 1.005–1.030)

## 2016-12-29 LAB — CBC
HCT: 34.5 % — ABNORMAL LOW (ref 35.0–47.0)
Hemoglobin: 11.1 g/dL — ABNORMAL LOW (ref 12.0–16.0)
MCH: 22.6 pg — ABNORMAL LOW (ref 26.0–34.0)
MCHC: 32.2 g/dL (ref 32.0–36.0)
MCV: 70.4 fL — AB (ref 80.0–100.0)
PLATELETS: 274 10*3/uL (ref 150–440)
RBC: 4.91 MIL/uL (ref 3.80–5.20)
RDW: 17 % — AB (ref 11.5–14.5)
WBC: 6.2 10*3/uL (ref 3.6–11.0)

## 2016-12-29 LAB — WET PREP, GENITAL
CLUE CELLS WET PREP: NONE SEEN
Sperm: NONE SEEN
TRICH WET PREP: NONE SEEN
YEAST WET PREP: NONE SEEN

## 2016-12-29 LAB — CHLAMYDIA/NGC RT PCR (ARMC ONLY)
Chlamydia Tr: NOT DETECTED
N gonorrhoeae: NOT DETECTED

## 2016-12-29 LAB — BASIC METABOLIC PANEL
ANION GAP: 7 (ref 5–15)
BUN: 17 mg/dL (ref 6–20)
CHLORIDE: 107 mmol/L (ref 101–111)
CO2: 24 mmol/L (ref 22–32)
CREATININE: 0.49 mg/dL (ref 0.44–1.00)
Calcium: 9.3 mg/dL (ref 8.9–10.3)
GFR calc Af Amer: >60 mL/min (ref >60)
GFR calc non Af Amer: >60 mL/min (ref >60)
GLUCOSE: 80 mg/dL (ref 65–99)
Potassium: 3.7 mmol/L (ref 3.5–5.1)
Sodium: 138 mmol/L (ref 135–145)

## 2016-12-29 LAB — TYPE AND SCREEN
ABO/RH(D): A POS
ANTIBODY SCREEN: NEGATIVE

## 2016-12-29 LAB — POCT PREGNANCY, URINE: Preg Test, Ur: NEGATIVE

## 2016-12-29 MED ORDER — ACETAMINOPHEN 325 MG PO TABS
650.0000 mg | ORAL_TABLET | Freq: Once | ORAL | Status: AC
  Administered 2016-12-29: 650 mg via ORAL

## 2016-12-29 MED ORDER — MEDROXYPROGESTERONE ACETATE 10 MG PO TABS: 10.0000 mg | ORAL_TABLET | Freq: Every day | ORAL | Status: DC

## 2016-12-29 MED ORDER — MEDROXYPROGESTERONE ACETATE 10 MG PO TABS: 10.0000 mg | ORAL_TABLET | Freq: Every day | ORAL | 0 refills | Status: DC

## 2016-12-29 MED ORDER — ACETAMINOPHEN 325 MG PO TABS
ORAL_TABLET | ORAL | Status: AC
  Filled 2016-12-29: qty 2
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
## 2016-12-29 NOTE — ED Triage Notes (Signed)
Pt presents to ED c/o vaginal bleeding since Thursday, dizziness x2 related to vag bleeding. Pt states that she recently had tubes tied October 12 2016 post C-section; has been bleeding through super tampons and super pads q 30 minutes.

## 2017-02-14 ENCOUNTER — Ambulatory Visit: Payer: 59 | Admitting: Obstetrics & Gynecology

## 2017-03-05 ENCOUNTER — Ambulatory Visit: Payer: 59 | Admitting: Obstetrics and Gynecology

## 2017-04-11 ENCOUNTER — Ambulatory Visit: Payer: 59 | Admitting: Obstetrics and Gynecology

## 2017-06-01 IMAGING — US US ART/VEN ABD/PELV/SCROTUM DOPPLER LTD
1 series · 13 of 25 positions shown · non-contrast
Comparison: 02/07/2014 obstetric scan.

CLINICAL DATA: 26-year-old female with 4 days of pelvic pain.
History of tubal ligation 10/12/2016. Uncertain LMP.

EXAM:
TRANSABDOMINAL AND TRANSVAGINAL ULTRASOUND OF PELVIS
DOPPLER ULTRASOUND OF OVARIES
TECHNIQUE: Both transabdominal and transvaginal ultrasound examinations of the
pelvis were performed. Transabdominal technique was performed for
global imaging of the pelvis including uterus, ovaries, adnexal
regions, and pelvic cul-de-sac.
It was necessary to proceed with endovaginal exam following the
transabdominal exam to visualize the endometrium and adnexa. Color
and duplex Doppler ultrasound was utilized to evaluate blood flow to
the ovaries.

[Series 1: us art/ven abd/pelv/scrotum doppler ltd · 0.22mm/px · 13 of 142 slices shown]
[im 1/142]
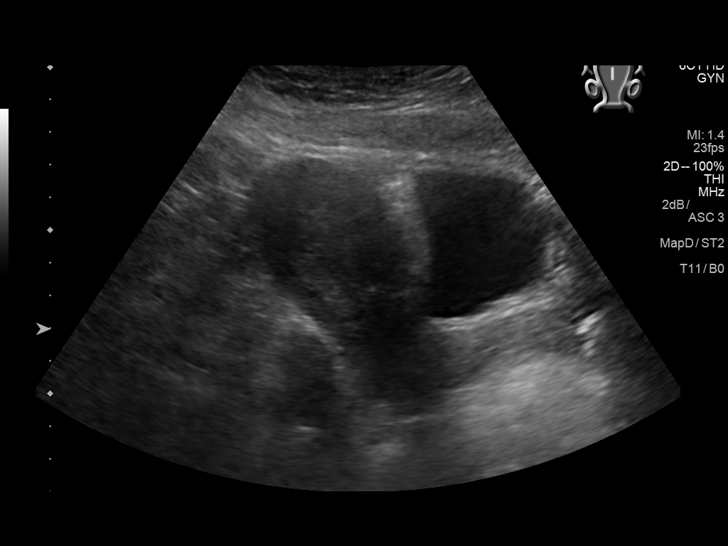
[im 12/142]
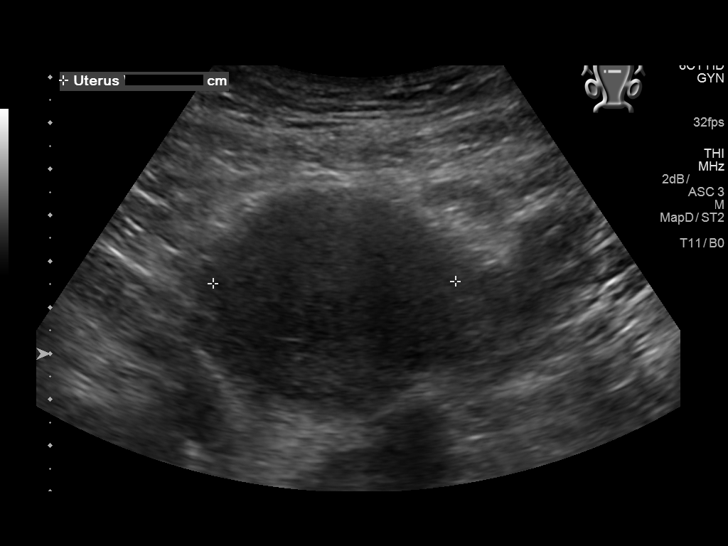
[im 24/142]
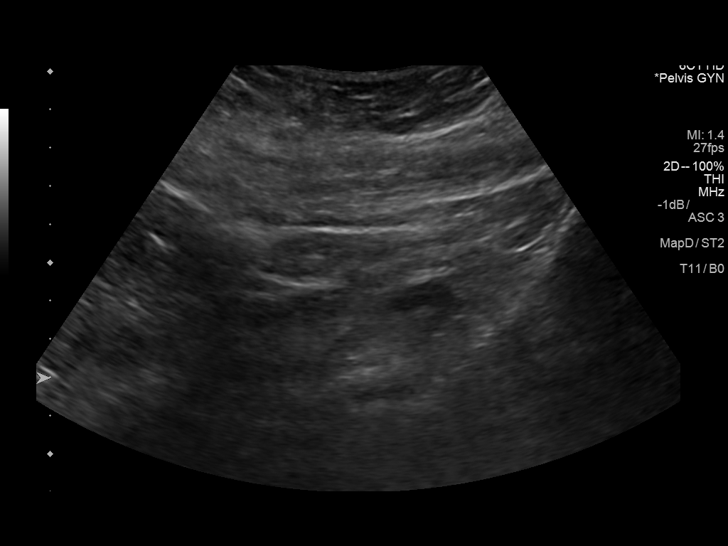
[im 36/142]
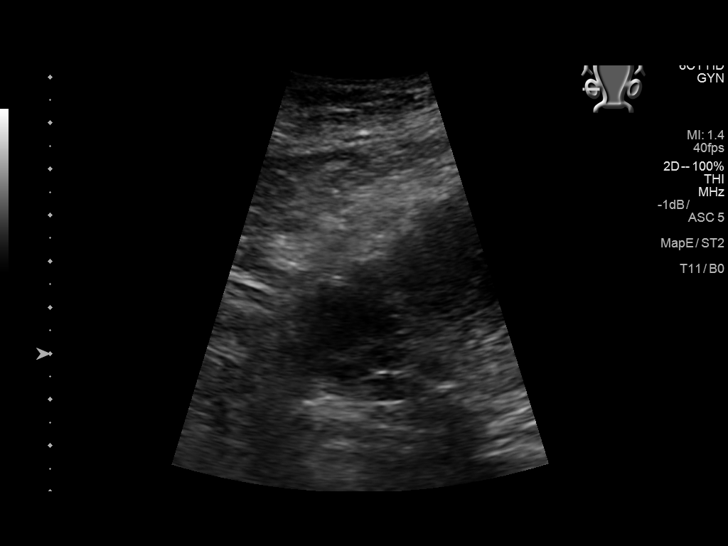
[im 48/142]
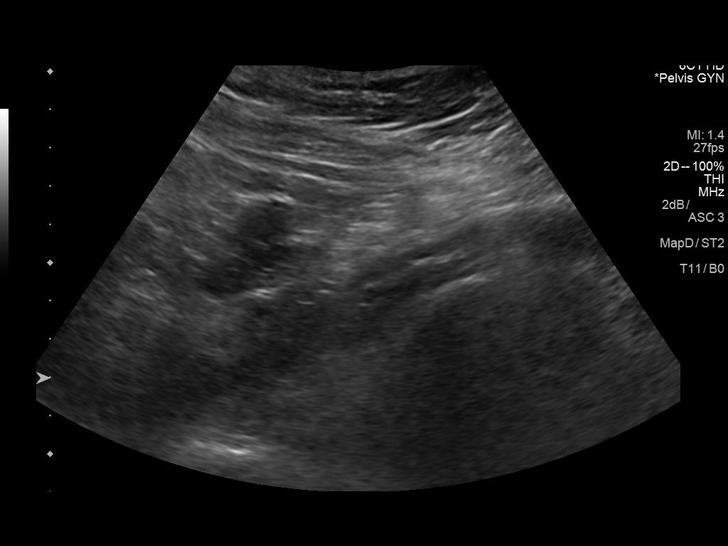
[im 59/142]
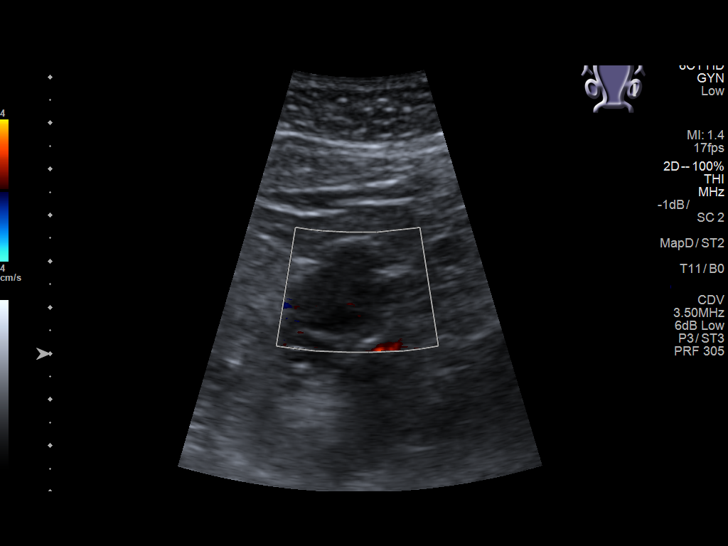
[im 71/142]
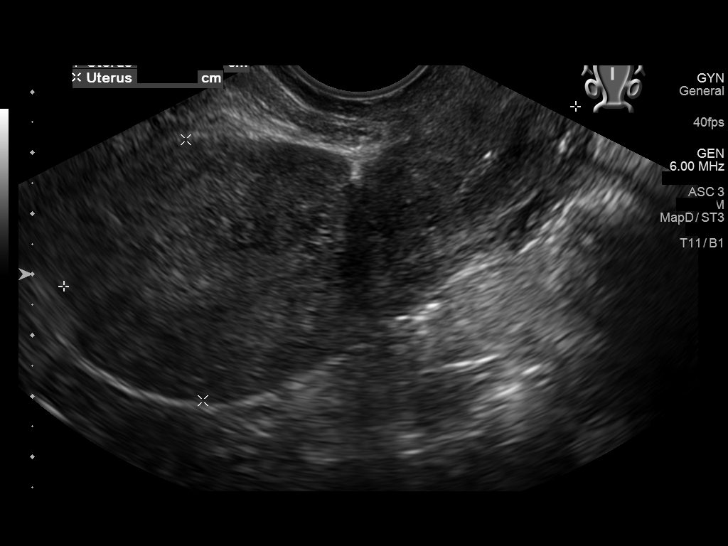
[im 83/142]
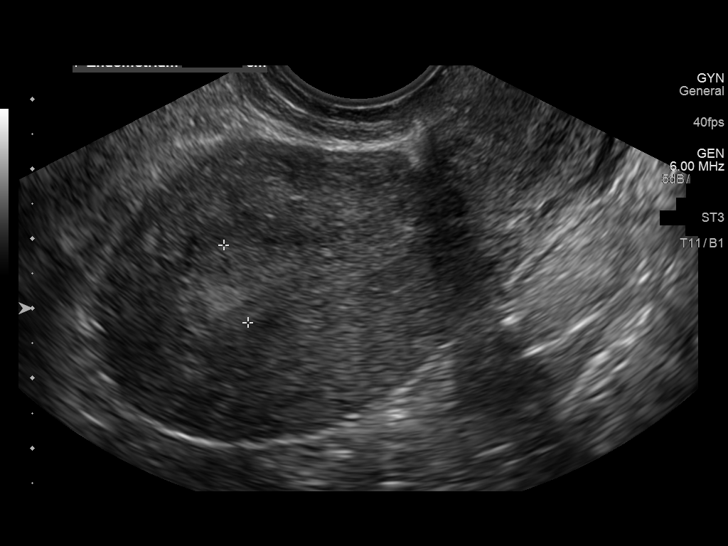
[im 95/142]
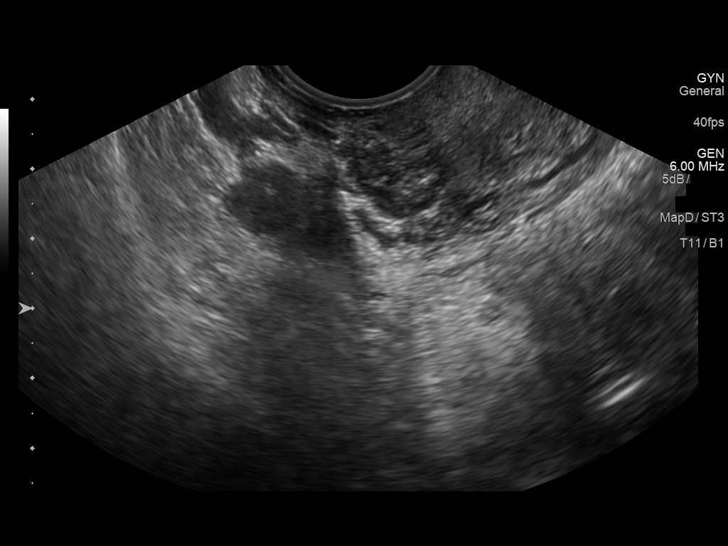
[im 106/142]
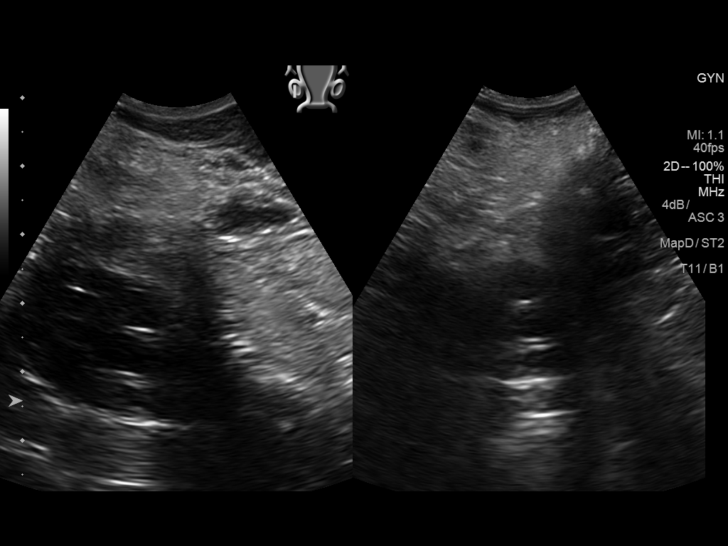
[im 118/142]
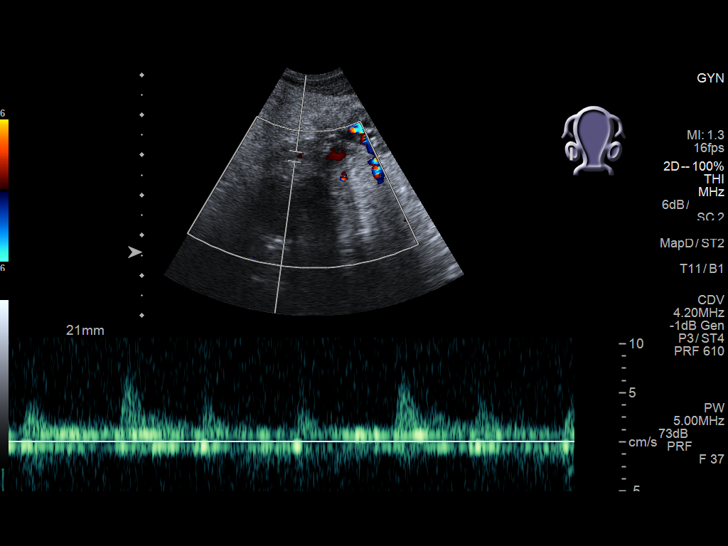
[im 130/142]
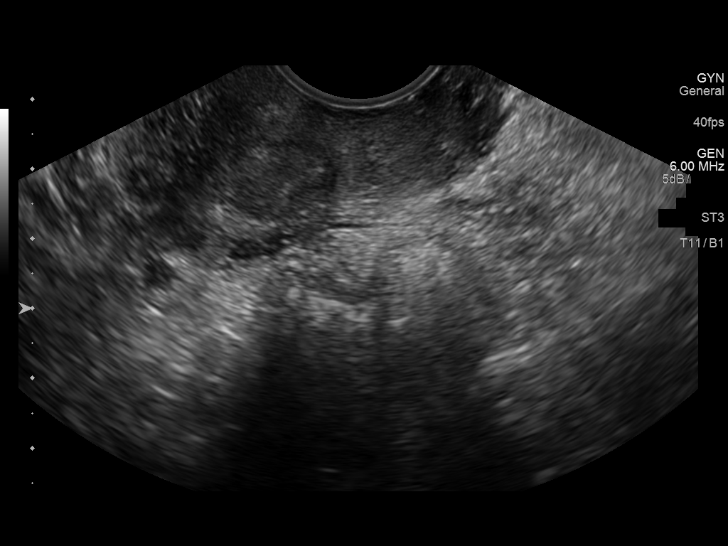
[im 142/142]
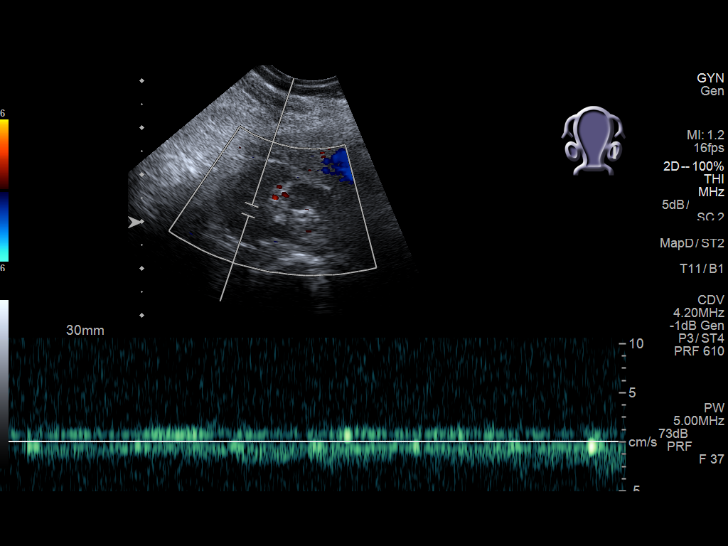

[13 of 25 positions shown; findings below may reference images not displayed]

FINDINGS: Uterus

Measurements: 8.9 x 4.3 x 5.3 cm. Anteverted uterus is normal in
size and configuration. Cesarean scar in the anterior lower uterine
segment. No fluid collection at the Cesarean scar site. No uterine
fibroids or other myometrial abnormality demonstrated.

Endometrium

Thickness: 12 mm. No endometrial cavity fluid or focal endometrial
mass.

Right ovary

Measurements: 3.3 x 2.5 x 2.0 cm. Right ovarian 2.5 x 1.8 x 1.5 cm
cystic structure with heterogeneous internal echoes and no internal
vascularity, compatible with a hemorrhagic right ovarian cyst. No
additional right ovarian or right adnexal masses.

Left ovary

Measurements: 2.8 x 1.6 x 1.8 cm (visualized only on the
transabdominal portion of the scan). Normal appearance/no adnexal
mass.

Pulsed Doppler evaluation of both ovaries demonstrates normal
low-resistance arterial and venous waveforms.

Other findings

No abnormal free fluid.
IMPRESSION: 1. No evidence of adnexal torsion.
2. Right ovarian 2.5 cm hemorrhagic cyst, for which no further
follow-up is required. No suspicious ovarian or adnexal masses.
3. No uterine fibroids.
4. Bilayer endometrial thickness 12 mm. No focal endometrial
abnormality.

## 2017-12-22 ENCOUNTER — Emergency Department (HOSPITAL_COMMUNITY): Payer: 59

## 2017-12-22 ENCOUNTER — Emergency Department (HOSPITAL_COMMUNITY)
Admission: EM | Admit: 2017-12-22 | Discharge: 2017-12-22 | Disposition: A | Discharge status: Home / Self Care | Payer: 59 | Attending: Emergency Medicine | Admitting: Emergency Medicine

## 2017-12-22 ENCOUNTER — Other Ambulatory Visit: Payer: Self-pay

## 2017-12-22 ENCOUNTER — Encounter (HOSPITAL_COMMUNITY): Payer: Self-pay

## 2017-12-22 DIAGNOSIS — Z87891 Personal history of nicotine dependence: Secondary | ICD-10-CM | POA: Insufficient documentation

## 2017-12-22 DIAGNOSIS — R079 Chest pain, unspecified: Secondary | ICD-10-CM | POA: Diagnosis present

## 2017-12-22 DIAGNOSIS — Z79899 Other long term (current) drug therapy: Secondary | ICD-10-CM | POA: Insufficient documentation

## 2017-12-22 DIAGNOSIS — R059 Cough, unspecified: Secondary | ICD-10-CM

## 2017-12-22 DIAGNOSIS — R05 Cough: Secondary | ICD-10-CM | POA: Insufficient documentation

## 2017-12-22 LAB — CBC
HEMATOCRIT: 38.9 % (ref 36.0–46.0)
Hemoglobin: 11.9 g/dL — ABNORMAL LOW (ref 12.0–15.0)
MCH: 25.2 pg — AB (ref 26.0–34.0)
MCHC: 30.6 g/dL (ref 30.0–36.0)
MCV: 82.2 fL (ref 78.0–100.0)
Platelets: 226 10*3/uL (ref 150–400)
RBC: 4.73 MIL/uL (ref 3.87–5.11)
RDW: 14.3 % (ref 11.5–15.5)
WBC: 6.8 10*3/uL (ref 4.0–10.5)

## 2017-12-22 LAB — BASIC METABOLIC PANEL
Anion gap: 13 (ref 5–15)
BUN: 11 mg/dL (ref 6–20)
CHLORIDE: 102 mmol/L (ref 101–111)
CO2: 20 mmol/L — AB (ref 22–32)
CREATININE: 0.54 mg/dL (ref 0.44–1.00)
Calcium: 8.7 mg/dL — ABNORMAL LOW (ref 8.9–10.3)
GFR calc Af Amer: >60 mL/min (ref >60)
GFR calc non Af Amer: >60 mL/min (ref >60)
Glucose, Bld: 121 mg/dL — ABNORMAL HIGH (ref 65–99)
POTASSIUM: 3.6 mmol/L (ref 3.5–5.1)
SODIUM: 135 mmol/L (ref 135–145)

## 2017-12-22 LAB — I-STAT TROPONIN, ED: Troponin i, poc: 0 ng/mL (ref 0.00–0.08)

## 2017-12-22 LAB — I-STAT BETA HCG BLOOD, ED (MC, WL, AP ONLY)
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
## 2017-12-22 NOTE — ED Triage Notes (Signed)
PT reports this morning at work she suddenly became diaphoretic, weak, and felt like she was on fire. Pt reports she checked sugar and it was 86. At this time she sat down to eat. After eating she began having burning in chest that "felt like someone had a lighter to my chest", and experienced SOB. Pt reports having cough x 1 week.

## 2017-12-22 NOTE — Discharge Instructions (Addendum)
Please read attached information. If you experience any new or worsening signs or symptoms please return to the emergency room for evaluation. Please follow-up with your primary care provider or specialist as discussed.  °

## 2017-12-26 ENCOUNTER — Encounter: Payer: Self-pay | Admitting: Maternal Newborn

## 2017-12-26 ENCOUNTER — Ambulatory Visit: Payer: 59 | Admitting: Maternal Newborn

## 2017-12-26 VITALS — BP 140/70 | HR 96 | Ht 63.0 in | Wt 264.0 lb

## 2017-12-26 DIAGNOSIS — N926 Irregular menstruation, unspecified: Secondary | ICD-10-CM

## 2017-12-26 DIAGNOSIS — Z3041 Encounter for surveillance of contraceptive pills: Secondary | ICD-10-CM

## 2017-12-26 MED ORDER — QUARTETTE 42-21-21-7 DAYS PO TABS: 1.0000 | ORAL_TABLET | Freq: Every day | ORAL | 3 refills | Status: DC

## 2017-12-27 ENCOUNTER — Encounter: Payer: Self-pay | Admitting: Maternal Newborn
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
## 2017-12-30 ENCOUNTER — Other Ambulatory Visit: Payer: Self-pay | Admitting: Maternal Newborn

## 2017-12-30 ENCOUNTER — Telehealth: Payer: Self-pay

## 2017-12-30 DIAGNOSIS — Z3041 Encounter for surveillance of contraceptive pills: Secondary | ICD-10-CM

## 2017-12-30 MED ORDER — LEVONORGEST-ETH ESTRAD 91-DAY 0.15-0.03 MG PO TABS: 1.0000 | ORAL_TABLET | Freq: Every day | ORAL | 4 refills | Status: DC

## 2017-12-30 NOTE — Progress Notes (Signed)
Changed Rx to [place] per advice from pharmacy about covered alternatives to [place].  Avel Sensor, CNM 12/30/2017  4:35 PM

## 2017-12-30 NOTE — Telephone Encounter (Signed)
Pharmacy called stating the [facility] that was prescribed to her was not covered and suggested that it would be switched to something covered.

## 2018-01-02 ENCOUNTER — Ambulatory Visit (INDEPENDENT_AMBULATORY_CARE_PROVIDER_SITE_OTHER): Payer: 59 | Admitting: Obstetrics and Gynecology

## 2018-01-02 ENCOUNTER — Encounter: Payer: Self-pay | Admitting: Obstetrics and Gynecology

## 2018-01-02 VITALS — BP 118/68 | HR 82 | Ht 63.0 in | Wt 233.0 lb

## 2018-01-02 DIAGNOSIS — Z6841 Body Mass Index (BMI) 40.0 and over, adult: Secondary | ICD-10-CM

## 2018-01-02 MED ORDER — PHENTERMINE HCL 37.5 MG PO TABS: 37.5000 mg | ORAL_TABLET | Freq: Every day | ORAL | 0 refills | Status: DC

## 2018-01-02 NOTE — Patient Instructions (Signed)
FAST FACTS . Body Mass Index (BMI) is one measurement that your doctor may use to discuss your weight . BMI is an estimate of body fat. Individuals with a BMI of 25.0-29.9 are considered overweight. Those with a BMI above 30.0 are considered obese . Obesity is a risk factor for many cancers, especially endometrial cancer. In fact, if you are obese, your risk for endometrial cancer may be 10 times higher. . Obesity may affect how your cancer is treated (surgery, chemotherapy, and/or radiation). . If you are overweight or obese, ask your doctor for information about diet and exercise programs.  EXERCISE Here is a list of resources for exercise recommendations and programs that can help you get started. Be sure to look for exercise programs and classes in your neighborhood to get personal support.  American Cancer Society (ACS): Eat Healthy and Get Active www.cancer.org/healthy/eathealthygetactive/ The site provides details about the importance of exercise in cancer prevention as well as resources providing exercise guidelines and tools to set goals and manage physical activity  American Council on Exercise (ACE): Get Fit www.acefitness.org/acefit  This site is full of fitness programs including personalized training workouts and a Art therapist of exercise programs. Links to local exercise trainers are provided.  American Heart Association: Getting Healthy - Physical Activity https://richard.com/ This site provides the American Heart Association guidelines for physical activity, tips for getting started and tips for long term success.  Exercising to Lose Weight Exercising can help you to lose weight. In order to lose weight through exercise, you need to do vigorous-intensity exercise. You can tell that you are exercising with vigorous intensity if you are breathing very hard and fast and cannot hold a conversation while  exercising. Moderate-intensity exercise helps to maintain your current weight. You can tell that you are exercising at a moderate level if you have a higher heart rate and faster breathing, but you are still able to hold a conversation. How often should I exercise? Choose an activity that you enjoy and set realistic goals. Your health care provider can help you to make an activity plan that works for you. Exercise regularly as directed by your health care provider. This may include:  Doing resistance training twice each week, such as: ? Push-ups. ? Sit-ups. ? Lifting weights. ? Using resistance bands.  Doing a given intensity of exercise for a given amount of time. Choose from these options: ? 150 minutes of moderate-intensity exercise every week. ? 75 minutes of vigorous-intensity exercise every week. ? A mix of moderate-intensity and vigorous-intensity exercise every week.  Children, pregnant women, people who are out of shape, people who are overweight, and older adults may need to consult a health care provider for individual recommendations. If you have any sort of medical condition, be sure to consult your health care provider before starting a new exercise program. What are some activities that can help me to lose weight?  Walking at a rate of at least 4.5 miles an hour.  Jogging or running at a rate of 5 miles per hour.  Biking at a rate of at least 10 miles per hour.  Lap swimming.  Roller-skating or in-line skating.  Cross-country skiing.  Vigorous competitive sports, such as football, basketball, and soccer.  Jumping rope.  Aerobic dancing. How can I be more active in my day-to-day activities?  Use the stairs instead of the elevator.  Take a walk during your lunch break.  If you drive, park your car farther away from work or school.  If you take public transportation, get off one stop early and walk the rest of the way.  Make all of your phone calls while  standing up and walking around.  Get up, stretch, and walk around every 30 minutes throughout the day. What guidelines should I follow while exercising?  Do not exercise so much that you hurt yourself, feel dizzy, or get very short of breath.  Consult your health care provider prior to starting a new exercise program.  Wear comfortable clothes and shoes with good support.  Drink plenty of water while you exercise to prevent dehydration or heat stroke. Body water is lost during exercise and must be replaced.  Work out until you breathe faster and your heart beats faster. This information is not intended to replace advice given to you by your health care provider. Make sure you discuss any questions you have with your health care provider. Document Released: 10/12/2010 Document Revised: 02/15/2016 Document Reviewed: 02/10/2014 Elsevier Interactive Patient Education  2018 [place] for [employer] Calories are units of energy. Your body needs a certain amount of calories from food to keep you going throughout the day. When you eat more calories than your body needs, your body stores the extra calories as fat. When you eat fewer calories than your body needs, your body burns fat to get the energy it needs. Calorie counting means keeping track of how many calories you eat and drink each day. Calorie counting can be helpful if you need to lose weight. If you make sure to eat fewer calories than your body needs, you should lose weight. Ask your health care provider what a healthy weight is for you. For calorie counting to work, you will need to eat the right number of calories in a day in order to lose a healthy amount of weight per week. A dietitian can help you determine how many calories you need in a day and will give you suggestions on how to reach your calorie goal.  A healthy amount of weight to lose per week is usually 1-2 lb (0.5-0.9 kg). This usually means that your  daily calorie intake should be reduced by 500-750 calories.  Eating 1,200 - 1,500 calories per day can help most women lose weight.  Eating 1,500 - 1,800 calories per day can help most men lose weight.  What is my plan? My goal is to have __________ calories per day. If I have this many calories per day, I should lose around __________ pounds per week. What do I need to know about calorie counting? In order to meet your daily calorie goal, you will need to:  Find out how many calories are in each food you would like to eat. Try to do this before you eat.  Decide how much of the food you plan to eat.  Write down what you ate and how many calories it had. Doing this is called keeping a food log.  To successfully lose weight, it is important to balance calorie counting with a healthy lifestyle that includes regular activity. Aim for 150 minutes of moderate exercise (such as walking) or 75 minutes of vigorous exercise (such as running) each week. Where do I find calorie information?  The number of calories in a food can be found on a Nutrition Facts label. If a food does not have a Nutrition Facts label, try to look up the calories online or ask your dietitian for help. Remember that calories are listed per  serving. If you choose to have more than one serving of a food, you will have to multiply the calories per serving by the amount of servings you plan to eat. For example, the label on a package of bread might say that a serving size is 1 slice and that there are 90 calories in a serving. If you eat 1 slice, you will have eaten 90 calories. If you eat 2 slices, you will have eaten 180 calories. How do I keep a food log? Immediately after each meal, record the following information in your food log:  What you ate. Don't forget to include toppings, sauces, and other extras on the food.  How much you ate. This can be measured in cups, ounces, or number of items.  How many calories each food  and drink had.  The total number of calories in the meal.  Keep your food log near you, such as in a small notebook in your pocket, or use a mobile app or website. Some programs will calculate calories for you and show you how many calories you have left for the day to meet your goal. What are some calorie counting tips?  Use your calories on foods and drinks that will fill you up and not leave you hungry: ? Some examples of foods that fill you up are nuts and nut butters, vegetables, lean proteins, and high-fiber foods like whole grains. High-fiber foods are foods with more than 5 g fiber per serving. ? Drinks such as sodas, specialty coffee drinks, alcohol, and juices have a lot of calories, yet do not fill you up.  Eat nutritious foods and avoid empty calories. Empty calories are calories you get from foods or beverages that do not have many vitamins or protein, such as candy, sweets, and soda. It is better to have a nutritious high-calorie food (such as an avocado) than a food with few nutrients (such as a bag of chips).  Know how many calories are in the foods you eat most often. This will help you calculate calorie counts faster.  Pay attention to calories in drinks. Low-calorie drinks include water and unsweetened drinks.  Pay attention to nutrition labels for "low fat" or "fat free" foods. These foods sometimes have the same amount of calories or more calories than the full fat versions. They also often have added sugar, starch, or salt, to make up for flavor that was removed with the fat.  Find a way of tracking calories that works for you. Get creative. Try different apps or programs if writing down calories does not work for you. What are some portion control tips?  Know how many calories are in a serving. This will help you know how many servings of a certain food you can have.  Use a measuring cup to measure serving sizes. You could also try weighing out portions on a kitchen  scale. With time, you will be able to estimate serving sizes for some foods.  Take some time to put servings of different foods on your favorite plates, bowls, and cups so you know what a serving looks like.  Try not to eat straight from a bag or box. Doing this can lead to overeating. Put the amount you would like to eat in a cup or on a plate to make sure you are eating the right portion.  Use smaller plates, glasses, and bowls to prevent overeating.  Try not to multitask (for example, watch TV or use your computer) while  eating. If it is time to eat, sit down at a table and enjoy your food. This will help you to know when you are full. It will also help you to be aware of what you are eating and how much you are eating. What are tips for following this plan? Reading food labels  Check the calorie count compared to the serving size. The serving size may be smaller than what you are used to eating.  Check the source of the calories. Make sure the food you are eating is high in vitamins and protein and low in saturated and trans fats. Shopping  Read nutrition labels while you shop. This will help you make healthy decisions before you decide to purchase your food.  Make a grocery list and stick to it. Cooking  Try to cook your favorite foods in a healthier way. For example, try baking instead of frying.  Use low-fat dairy products. Meal planning  Use more fruits and vegetables. Half of your plate should be fruits and vegetables.  Include lean proteins like poultry and fish. How do I count calories when eating out?  Ask for smaller portion sizes.  Consider sharing an entree and sides instead of getting your own entree.  If you get your own entree, eat only half. Ask for a box at the beginning of your meal and put the rest of your entree in it so you are not tempted to eat it.  If calories are listed on the menu, choose the lower calorie options.  Choose dishes that include  vegetables, fruits, whole grains, low-fat dairy products, and lean protein.  Choose items that are boiled, broiled, grilled, or steamed. Stay away from items that are buttered, battered, fried, or served with cream sauce. Items labeled "crispy" are usually fried, unless stated otherwise.  Choose water, low-fat milk, unsweetened iced tea, or other drinks without added sugar. If you want an alcoholic beverage, choose a lower calorie option such as a glass of wine or light beer.  Ask for dressings, sauces, and syrups on the side. These are usually high in calories, so you should limit the amount you eat.  If you want a salad, choose a garden salad and ask for grilled meats. Avoid extra toppings like bacon, cheese, or fried items. Ask for the dressing on the side, or ask for olive oil and vinegar or lemon to use as dressing.  Estimate how many servings of a food you are given. For example, a serving of cooked rice is  cup or about the size of half a baseball. Knowing serving sizes will help you be aware of how much food you are eating at restaurants. The list below tells you how big or small some common portion sizes are based on everyday objects: ? 1 oz-4 stacked dice. ? 3 oz-1 deck of cards. ? 1 tsp-1 die. ? 1 Tbsp- a ping-pong ball. ? 2 Tbsp-1 ping-pong ball. ?  cup- baseball. ? 1 cup-1 baseball. Summary  Calorie counting means keeping track of how many calories you eat and drink each day. If you eat fewer calories than your body needs, you should lose weight.  A healthy amount of weight to lose per week is usually 1-2 lb (0.5-0.9 kg). This usually means reducing your daily calorie intake by 500-750 calories.  The number of calories in a food can be found on a Nutrition Facts label. If a food does not have a Nutrition Facts label, try to look up the calories  online or ask your dietitian for help.  Use your calories on foods and drinks that will fill you up, and not on foods and drinks  that will leave you hungry.  Use smaller plates, glasses, and bowls to prevent overeating. This information is not intended to replace advice given to you by your health care provider. Make sure you discuss any questions you have with your health care provider. Document Released: 09/09/2005 Document Revised: 08/09/2016 Document Reviewed: 08/09/2016 Elsevier Interactive Patient Education  [employer].
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
## 2018-01-30 ENCOUNTER — Encounter: Payer: Self-pay | Admitting: Obstetrics and Gynecology

## 2018-01-30 ENCOUNTER — Ambulatory Visit: Payer: 59 | Admitting: Obstetrics and Gynecology

## 2018-01-30 VITALS — BP 132/80 | HR 87 | Ht 63.0 in | Wt 226.0 lb

## 2018-01-30 DIAGNOSIS — Z6841 Body Mass Index (BMI) 40.0 and over, adult: Secondary | ICD-10-CM | POA: Diagnosis not present

## 2018-01-30 MED ORDER — PHENTERMINE HCL 37.5 MG PO TABS: 37.5000 mg | ORAL_TABLET | Freq: Every day | ORAL | 0 refills | Status: DC
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
## 2018-02-27 ENCOUNTER — Ambulatory Visit: Payer: 59 | Admitting: Obstetrics and Gynecology

## 2018-03-03 ENCOUNTER — Telehealth: Payer: Self-pay

## 2018-03-03 ENCOUNTER — Ambulatory Visit (INDEPENDENT_AMBULATORY_CARE_PROVIDER_SITE_OTHER): Payer: 59 | Admitting: Obstetrics and Gynecology

## 2018-03-03 ENCOUNTER — Encounter: Payer: Self-pay | Admitting: Obstetrics and Gynecology

## 2018-03-03 VITALS — BP 128/78 | HR 92 | Ht 63.0 in | Wt 222.0 lb

## 2018-03-03 DIAGNOSIS — K649 Unspecified hemorrhoids: Secondary | ICD-10-CM | POA: Diagnosis not present

## 2018-03-03 DIAGNOSIS — N921 Excessive and frequent menstruation with irregular cycle: Secondary | ICD-10-CM

## 2018-03-03 DIAGNOSIS — Z8742 Personal history of other diseases of the female genital tract: Secondary | ICD-10-CM | POA: Diagnosis not present

## 2018-03-03 DIAGNOSIS — N898 Other specified noninflammatory disorders of vagina: Secondary | ICD-10-CM | POA: Diagnosis not present

## 2018-03-03 DIAGNOSIS — R102 Pelvic and perineal pain: Secondary | ICD-10-CM | POA: Diagnosis not present

## 2018-03-03 MED ORDER — HYDROCORTISONE ACETATE 25 MG RE SUPP: 25.0000 mg | Freq: Two times a day (BID) | RECTAL | 1 refills | Status: DC

## 2018-03-03 MED ORDER — ESTRADIOL 2 MG PO TABS
2.0000 mg | ORAL_TABLET | Freq: Every day | ORAL | 0 refills | Status: DC
Start: 2018-03-03 — End: 2018-04-24
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
## 2018-03-03 NOTE — Telephone Encounter (Signed)
Pt seen today & was to receive estradiol & a hemorrhoid cream. Estradiol rx was received but the hemorrhoid cream was not. Pt requesting cream to be sent & her to be notified once done. FX#[phone]

## 2018-03-03 NOTE — Telephone Encounter (Signed)
Thank you, prescription sent. Left message for patient.  Dr. Gilman Schmidt

## 2018-03-06 ENCOUNTER — Ambulatory Visit: Payer: 59 | Admitting: Obstetrics and Gynecology

## 2018-03-11 ENCOUNTER — Encounter: Payer: Self-pay | Admitting: Obstetrics and Gynecology

## 2018-03-11 ENCOUNTER — Ambulatory Visit (INDEPENDENT_AMBULATORY_CARE_PROVIDER_SITE_OTHER): Payer: 59

## 2018-03-11 ENCOUNTER — Ambulatory Visit (INDEPENDENT_AMBULATORY_CARE_PROVIDER_SITE_OTHER): Payer: 59 | Admitting: Obstetrics and Gynecology

## 2018-03-11 VITALS — BP 120/80 | Ht 63.0 in | Wt 224.0 lb

## 2018-03-11 DIAGNOSIS — R102 Pelvic and perineal pain: Secondary | ICD-10-CM

## 2018-03-11 DIAGNOSIS — N921 Excessive and frequent menstruation with irregular cycle: Secondary | ICD-10-CM | POA: Diagnosis not present

## 2018-03-11 DIAGNOSIS — D251 Intramural leiomyoma of uterus: Secondary | ICD-10-CM

## 2018-03-11 DIAGNOSIS — D250 Submucous leiomyoma of uterus: Secondary | ICD-10-CM

## 2018-03-11 DIAGNOSIS — N926 Irregular menstruation, unspecified: Secondary | ICD-10-CM
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
## 2018-03-18 ENCOUNTER — Telehealth: Payer: Self-pay

## 2018-03-18 NOTE — Telephone Encounter (Signed)
Pt triage today stating that she would like to know if there was somewhere closer she could be referred to then the [facility] area and maybe somewhere that would be able to get her in sooner. Please advise. Thank you!

## 2018-03-19 ENCOUNTER — Telehealth: Payer: Self-pay | Admitting: Obstetrics and Gynecology

## 2018-03-19 NOTE — Telephone Encounter (Signed)
Called patient, left message that [facility] was the closest practice in the area the provides this service and that she is being seen at their earliest availability. Julie Estrada has called two other practices in the area but they do not provided fibroid embolization. Adrian Prows MD [facility], [facility] 03/19/18 2:03 PM

## 2018-03-19 NOTE — Telephone Encounter (Signed)
I've been speaking to [place] to see if she can find someone closer, Izora Gala is looking into it currently.

## 2018-03-19 NOTE — Telephone Encounter (Signed)
Pt states she is being referred to a vascular surgeon for her fibroid. Her pain is increasingly getting worse. Her consultation isn't until 7/16. Pt requesting one closer that can get her in sooner. Pain is getting unbearable & interfering with her daily life. YF#[phone].

## 2018-03-20 ENCOUNTER — Encounter: Payer: Self-pay | Admitting: Obstetrics and Gynecology

## 2018-03-20 ENCOUNTER — Ambulatory Visit (INDEPENDENT_AMBULATORY_CARE_PROVIDER_SITE_OTHER): Payer: 59 | Admitting: Obstetrics and Gynecology

## 2018-03-20 VITALS — BP 124/72 | HR 92 | Ht 63.0 in | Wt 224.0 lb

## 2018-03-20 DIAGNOSIS — E669 Obesity, unspecified: Secondary | ICD-10-CM | POA: Diagnosis not present

## 2018-03-20 DIAGNOSIS — Z6839 Body mass index (BMI) 39.0-39.9, adult: Secondary | ICD-10-CM | POA: Diagnosis not present

## 2018-03-20 NOTE — Telephone Encounter (Signed)
Dr. Gilman Schmidt responded to the patient in a different telephone encounter.
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
## 2018-03-23 ENCOUNTER — Other Ambulatory Visit: Payer: Self-pay | Admitting: Obstetrics and Gynecology

## 2018-03-24 ENCOUNTER — Other Ambulatory Visit: Payer: Self-pay

## 2018-03-24 MED ORDER — PHENTERMINE HCL 37.5 MG PO TABS: 37.5000 mg | ORAL_TABLET | Freq: Every day | ORAL | 0 refills | Status: DC

## 2018-04-12 IMAGING — CR DG CHEST 2V
2 series · 2 of 2 positions shown · non-contrast
Comparison: None.

CLINICAL DATA: 25-year-old female with a history of dizziness

EXAM:
CHEST - 2 VIEW

[chest pa]
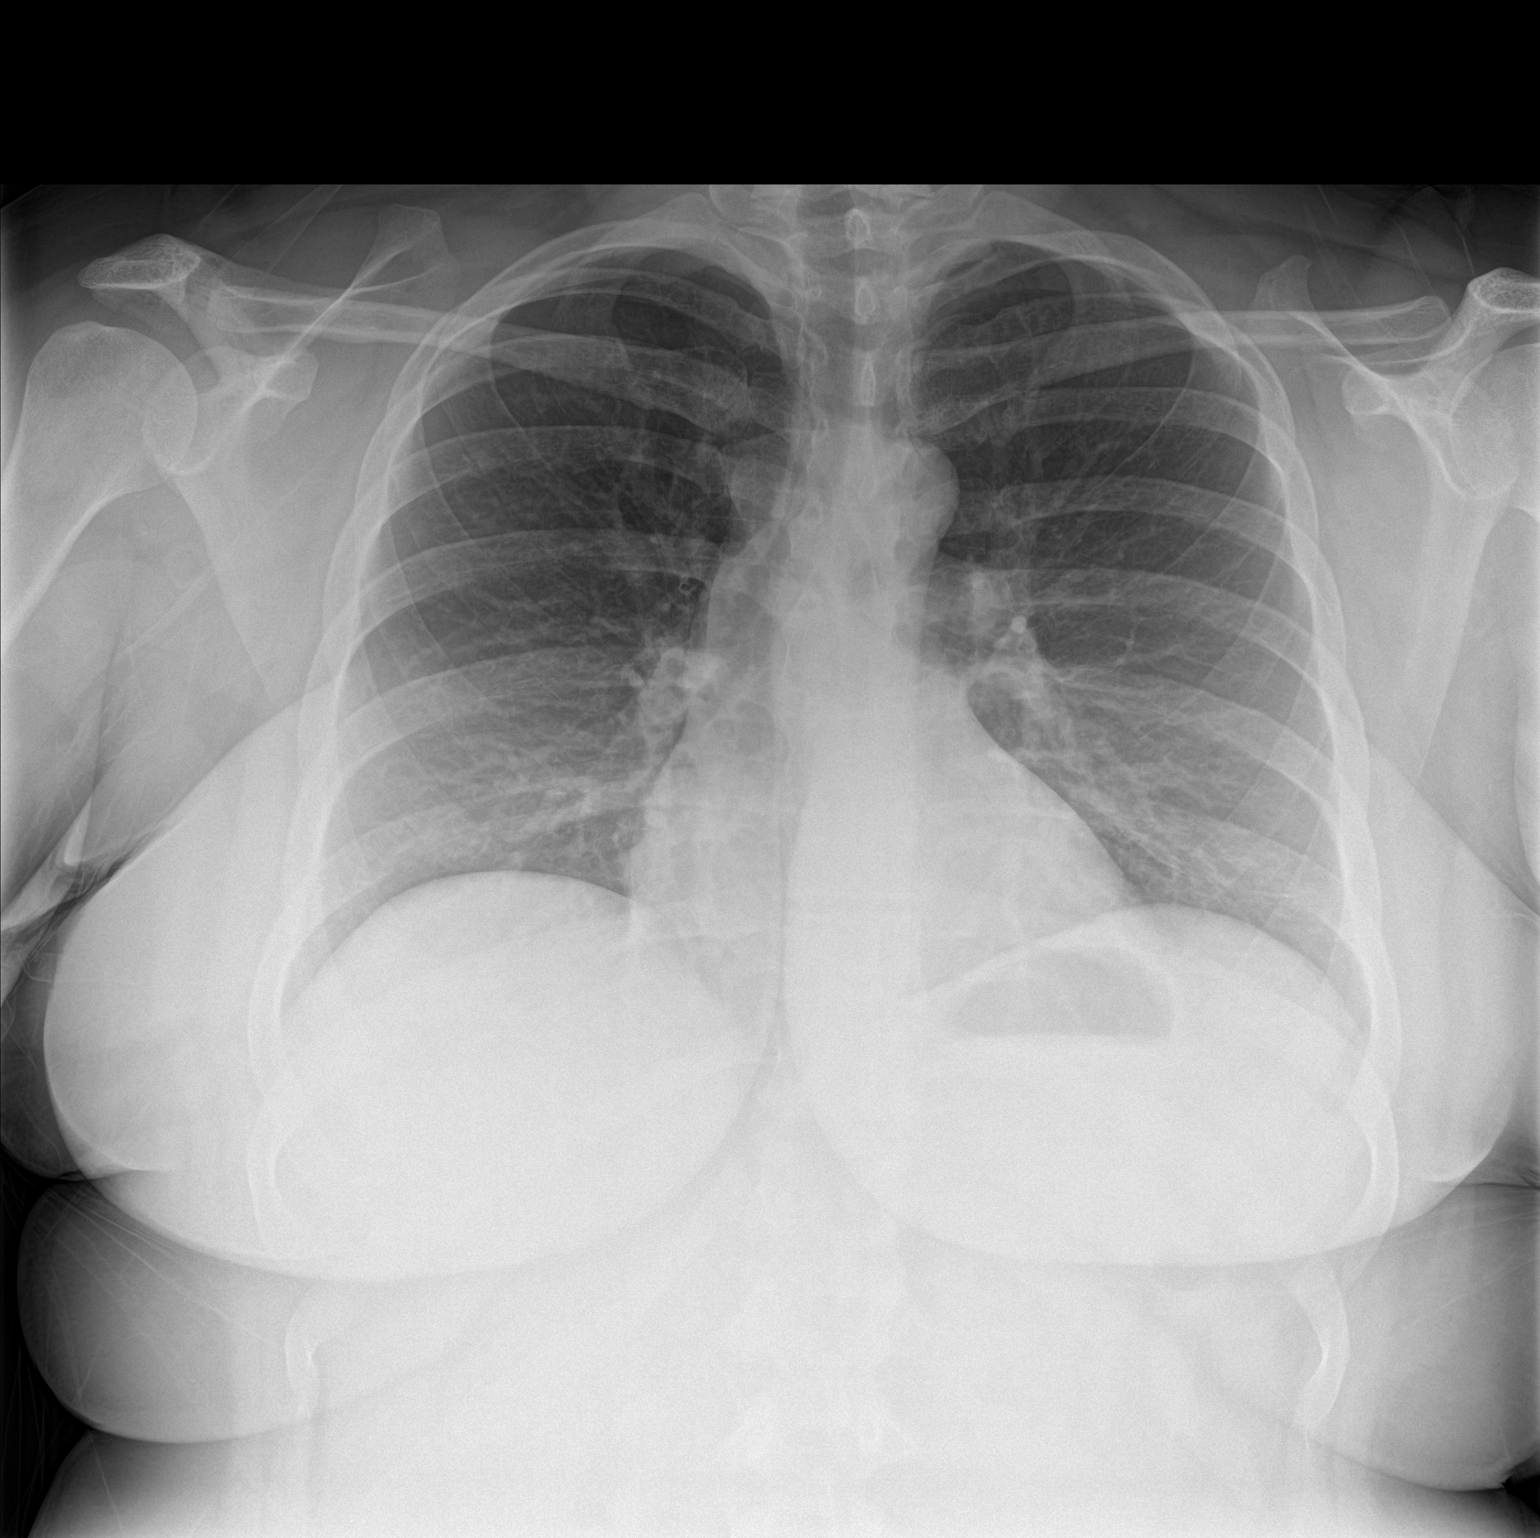

[chest lat]
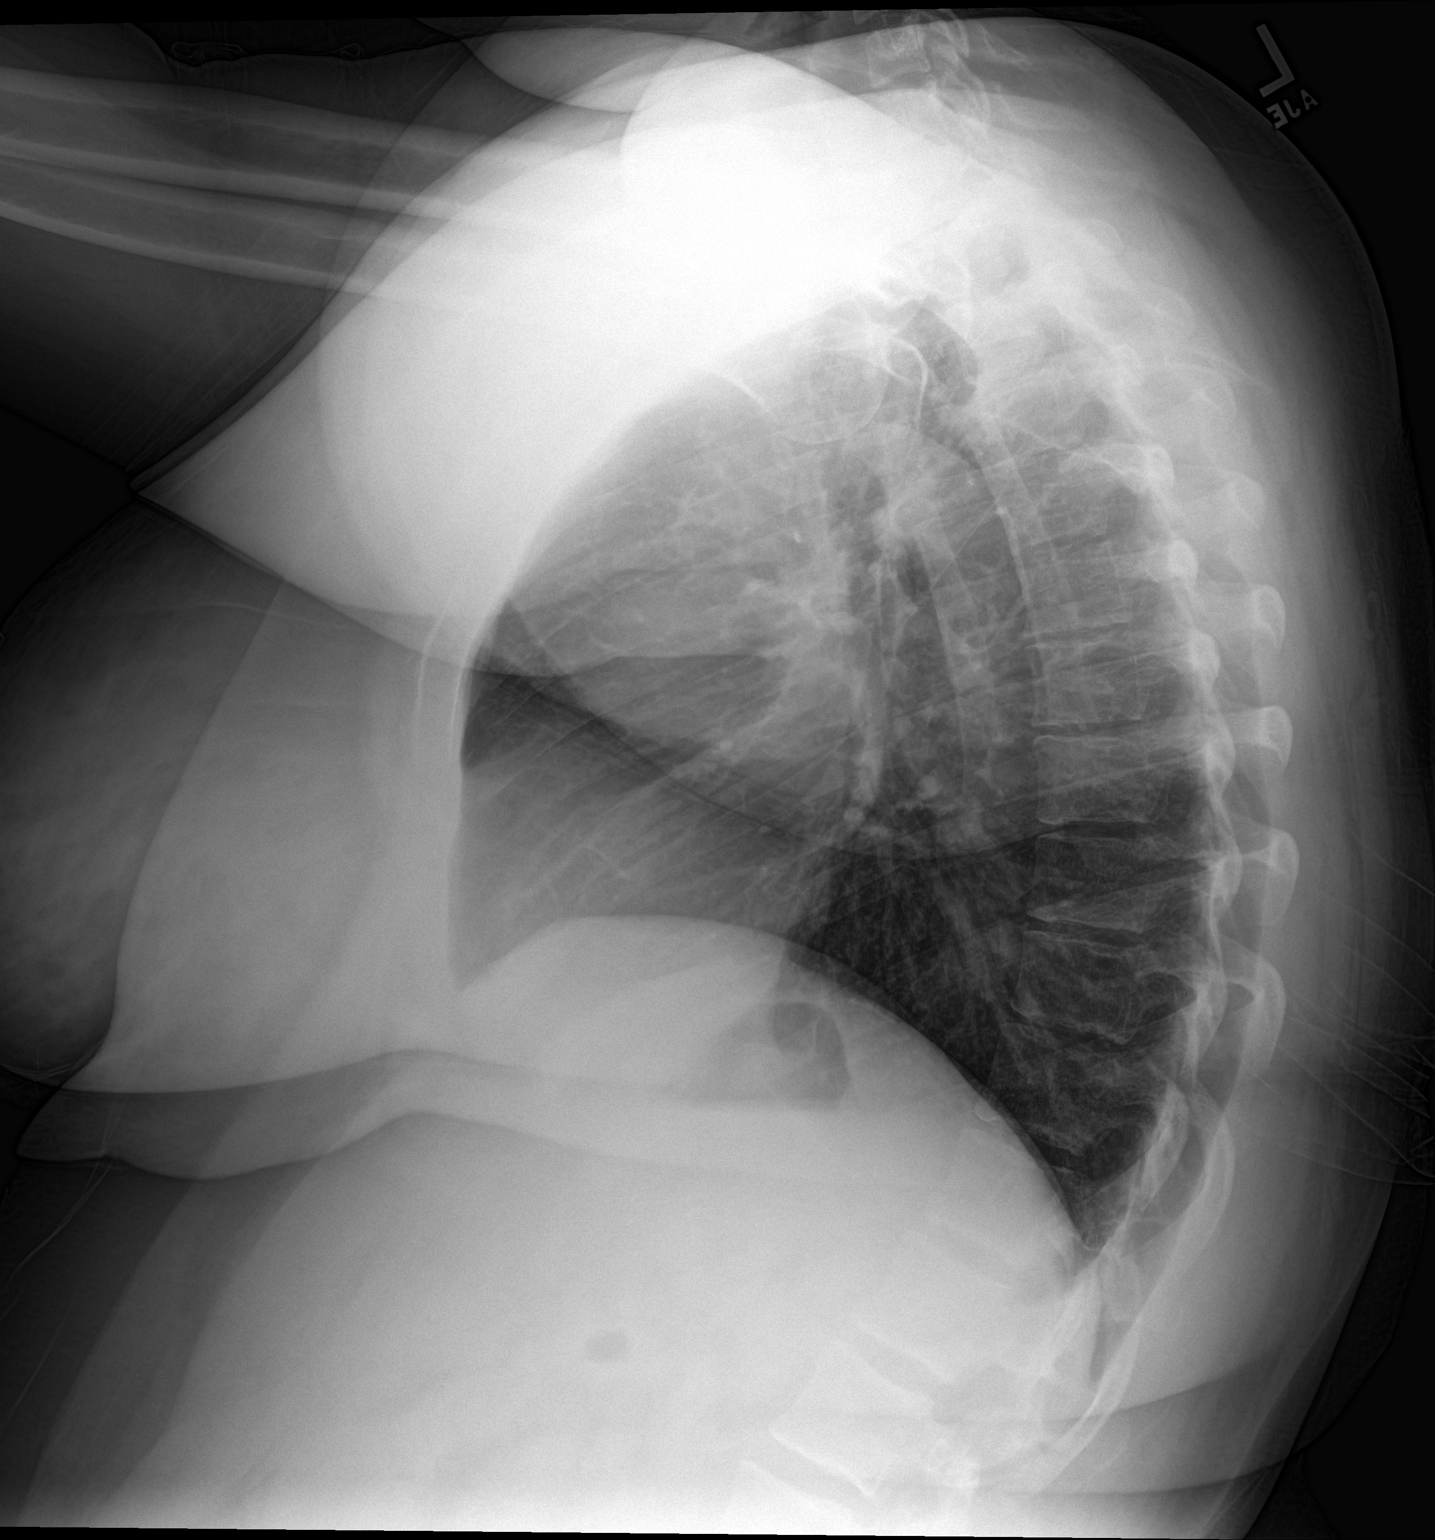

[2 of 2 positions shown; findings below may reference images not displayed]

FINDINGS: Cardiomediastinal silhouette projects within normal limits in size
and contour. No confluent airspace disease, pneumothorax, or pleural
effusion.

No displaced fracture.

Unremarkable appearance of the upper abdomen.
IMPRESSION: No radiographic evidence of acute cardiopulmonary disease.

## 2018-04-23 HISTORY — PX: UTERINE FIBROID EMBOLIZATION: SHX825

## 2018-04-24 ENCOUNTER — Encounter: Payer: Self-pay | Admitting: Obstetrics and Gynecology

## 2018-04-24 ENCOUNTER — Ambulatory Visit (INDEPENDENT_AMBULATORY_CARE_PROVIDER_SITE_OTHER): Payer: 59 | Admitting: Obstetrics and Gynecology

## 2018-04-24 VITALS — BP 120/64 | HR 83 | Ht 63.0 in | Wt 222.0 lb

## 2018-04-24 DIAGNOSIS — Z6839 Body mass index (BMI) 39.0-39.9, adult: Secondary | ICD-10-CM

## 2018-04-24 DIAGNOSIS — E669 Obesity, unspecified: Secondary | ICD-10-CM

## 2018-04-24 MED ORDER — ONDANSETRON 4 MG PO TBDP: 4.0000 mg | ORAL_TABLET | Freq: Four times a day (QID) | ORAL | 0 refills | Status: DC | PRN

## 2018-04-24 MED ORDER — NALTREXONE-BUPROPION HCL ER 8-90 MG PO TB12: 2.0000 | ORAL_TABLET | Freq: Two times a day (BID) | ORAL | 2 refills | Status: DC
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
## 2018-04-28 ENCOUNTER — Emergency Department: Payer: 59

## 2018-04-28 ENCOUNTER — Emergency Department
Admission: EM | Admit: 2018-04-28 | Discharge: 2018-04-28 | Disposition: A | Discharge status: Home / Self Care | Payer: 59 | Attending: Student in an Organized Health Care Education/Training Program | Admitting: Student in an Organized Health Care Education/Training Program

## 2018-04-28 ENCOUNTER — Other Ambulatory Visit: Payer: Self-pay

## 2018-04-28 ENCOUNTER — Encounter: Payer: Self-pay | Admitting: Emergency Medicine

## 2018-04-28 DIAGNOSIS — S93401A Sprain of unspecified ligament of right ankle, initial encounter: Secondary | ICD-10-CM

## 2018-04-28 DIAGNOSIS — Y998 Other external cause status: Secondary | ICD-10-CM | POA: Diagnosis not present

## 2018-04-28 DIAGNOSIS — Y9302 Activity, running: Secondary | ICD-10-CM | POA: Diagnosis not present

## 2018-04-28 DIAGNOSIS — Z79899 Other long term (current) drug therapy: Secondary | ICD-10-CM | POA: Insufficient documentation

## 2018-04-28 DIAGNOSIS — F17210 Nicotine dependence, cigarettes, uncomplicated: Secondary | ICD-10-CM | POA: Diagnosis not present

## 2018-04-28 DIAGNOSIS — S99911A Unspecified injury of right ankle, initial encounter: Secondary | ICD-10-CM | POA: Diagnosis present

## 2018-04-28 DIAGNOSIS — W172XXA Fall into hole, initial encounter: Secondary | ICD-10-CM | POA: Diagnosis not present

## 2018-04-28 DIAGNOSIS — Y9222 Religious institution as the place of occurrence of the external cause: Secondary | ICD-10-CM | POA: Diagnosis not present

## 2018-04-28 MED ORDER — NAPROXEN 500 MG PO TABS: 500.0000 mg | ORAL_TABLET | Freq: Two times a day (BID) | ORAL | 0 refills | Status: DC
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
## 2018-04-28 NOTE — ED Triage Notes (Signed)
Pt presents to ED with right ankle pain. Pt states she stepped in a hole and heard a pop. Swelling noted. Ace wrap and ice applied from home. +movement and sensation.

## 2018-06-29 ENCOUNTER — Other Ambulatory Visit: Payer: Self-pay | Admitting: Obstetrics and Gynecology

## 2018-06-29 NOTE — Telephone Encounter (Signed)
Please advise 

## 2018-07-05 ENCOUNTER — Encounter: Payer: Self-pay | Admitting: Emergency Medicine

## 2018-07-05 ENCOUNTER — Emergency Department
Admission: EM | Admit: 2018-07-05 | Discharge: 2018-07-05 | Disposition: A | Discharge status: Home / Self Care | Payer: 59 | Attending: Emergency Medicine | Admitting: Emergency Medicine

## 2018-07-05 DIAGNOSIS — R197 Diarrhea, unspecified: Secondary | ICD-10-CM | POA: Insufficient documentation

## 2018-07-05 DIAGNOSIS — F17210 Nicotine dependence, cigarettes, uncomplicated: Secondary | ICD-10-CM | POA: Insufficient documentation

## 2018-07-05 DIAGNOSIS — Z79899 Other long term (current) drug therapy: Secondary | ICD-10-CM | POA: Insufficient documentation

## 2018-07-05 DIAGNOSIS — R112 Nausea with vomiting, unspecified: Secondary | ICD-10-CM | POA: Insufficient documentation

## 2018-07-05 LAB — CBC WITH DIFFERENTIAL/PLATELET
ABS IMMATURE GRANULOCYTES: 0.07 10*3/uL (ref 0.00–0.07)
BASOS ABS: 0 10*3/uL (ref 0.0–0.1)
Basophils Relative: 0 %
Eosinophils Absolute: 0.2 10*3/uL (ref 0.0–0.5)
Eosinophils Relative: 1 %
HCT: 46.4 % — ABNORMAL HIGH (ref 36.0–46.0)
HEMOGLOBIN: 14.2 g/dL (ref 12.0–15.0)
IMMATURE GRANULOCYTES: 0 %
LYMPHS PCT: 6 %
Lymphs Abs: 1 10*3/uL (ref 0.7–4.0)
MCH: 25.1 pg — ABNORMAL LOW (ref 26.0–34.0)
MCHC: 30.6 g/dL (ref 30.0–36.0)
MCV: 82 fL (ref 80.0–100.0)
MONO ABS: 1.1 10*3/uL — AB (ref 0.1–1.0)
Monocytes Relative: 7 %
NEUTROS ABS: 14.5 10*3/uL — AB (ref 1.7–7.7)
NEUTROS PCT: 86 %
NRBC: 0 % (ref 0.0–0.2)
Platelets: 309 10*3/uL (ref 150–400)
RBC: 5.66 MIL/uL — ABNORMAL HIGH (ref 3.87–5.11)
RDW: 14.1 % (ref 11.5–15.5)
WBC: 17 10*3/uL — AB (ref 4.0–10.5)

## 2018-07-05 LAB — COMPREHENSIVE METABOLIC PANEL
ALBUMIN: 4.1 g/dL (ref 3.5–5.0)
ALK PHOS: 68 U/L (ref 38–126)
ALT: 22 U/L (ref 0–44)
AST: 20 U/L (ref 15–41)
Anion gap: 8 (ref 5–15)
BILIRUBIN TOTAL: 0.5 mg/dL (ref 0.3–1.2)
BUN: 18 mg/dL (ref 6–20)
CALCIUM: 8.6 mg/dL — AB (ref 8.9–10.3)
CO2: 23 mmol/L (ref 22–32)
CREATININE: 0.65 mg/dL (ref 0.44–1.00)
Chloride: 107 mmol/L (ref 98–111)
GFR calc Af Amer: >60 mL/min (ref >60)
GLUCOSE: 109 mg/dL — AB (ref 70–99)
POTASSIUM: 4.2 mmol/L (ref 3.5–5.1)
Sodium: 138 mmol/L (ref 135–145)
TOTAL PROTEIN: 8 g/dL (ref 6.5–8.1)

## 2018-07-05 LAB — C DIFFICILE QUICK SCREEN W PCR REFLEX
C Diff antigen: NEGATIVE
C Diff interpretation: NOT DETECTED
C Diff toxin: NEGATIVE

## 2018-07-05 LAB — LIPASE, BLOOD: LIPASE: 22 U/L (ref 11–51)

## 2018-07-05 LAB — POCT PREGNANCY, URINE: Preg Test, Ur: NEGATIVE

## 2018-07-05 MED ORDER — ONDANSETRON HCL 4 MG/2ML IJ SOLN
4.0000 mg | Freq: Once | INTRAMUSCULAR | Status: AC
  Administered 2018-07-05: 4 mg via INTRAVENOUS
  Filled 2018-07-05: qty 2

## 2018-07-05 MED ORDER — ONDANSETRON HCL 4 MG PO TABS: 4.0000 mg | ORAL_TABLET | Freq: Three times a day (TID) | ORAL | 0 refills | Status: DC | PRN

## 2018-07-05 MED ORDER — SODIUM CHLORIDE 0.9 % IV BOLUS
1000.0000 mL | Freq: Once | INTRAVENOUS | Status: AC
  Administered 2018-07-05: 1000 mL via INTRAVENOUS

## 2018-07-05 NOTE — ED Notes (Signed)
Pt states that she does not have to urinate or have a bowel movement at this moment.

## 2018-07-05 NOTE — Discharge Instructions (Addendum)
Return to the emergency room for any new or worrisome symptoms including severe pain, persistent fever, persistent vomiting, blood in your stool or other concerns.
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
## 2018-07-05 NOTE — ED Triage Notes (Signed)
Patient presents to the ED with diarrhea q 10 minutes since 9am.  Patient reports vomiting x 4 today.  Patient states, "the last time I vomited I almost passed out."  Patient reports her diarrhea is now clear.  Patient states she and her mother-in-law also has similar symptoms and ate the same food patient did, last night, chicken pasta.  Patient states her daughter also had a stomach virus earlier in the week.

## 2018-07-06 LAB — GASTROINTESTINAL PANEL BY PCR, STOOL (REPLACES STOOL CULTURE)
ASTROVIRUS: NOT DETECTED
Adenovirus F40/41: NOT DETECTED
CAMPYLOBACTER SPECIES: NOT DETECTED
CYCLOSPORA CAYETANENSIS: NOT DETECTED
Cryptosporidium: NOT DETECTED
ENTAMOEBA HISTOLYTICA: NOT DETECTED
ENTEROAGGREGATIVE E COLI (EAEC): NOT DETECTED
ENTEROPATHOGENIC E COLI (EPEC): DETECTED — AB
ENTEROTOXIGENIC E COLI (ETEC): NOT DETECTED
GIARDIA LAMBLIA: NOT DETECTED
Norovirus GI/GII: NOT DETECTED
Plesimonas shigelloides: NOT DETECTED
Rotavirus A: NOT DETECTED
Salmonella species: NOT DETECTED
Sapovirus (I, II, IV, and V): DETECTED — AB
Shiga like toxin producing E coli (STEC): NOT DETECTED
Shigella/Enteroinvasive E coli (EIEC): NOT DETECTED
VIBRIO CHOLERAE: NOT DETECTED
VIBRIO SPECIES: NOT DETECTED
YERSINIA ENTEROCOLITICA: NOT DETECTED

## 2018-07-31 ENCOUNTER — Ambulatory Visit: Payer: Self-pay | Admitting: Obstetrics and Gynecology

## 2018-10-05 ENCOUNTER — Encounter: Payer: Self-pay | Admitting: Obstetrics and Gynecology

## 2018-10-05 ENCOUNTER — Ambulatory Visit (INDEPENDENT_AMBULATORY_CARE_PROVIDER_SITE_OTHER): Payer: BLUE CROSS/BLUE SHIELD | Admitting: Obstetrics and Gynecology

## 2018-10-05 VITALS — BP 124/76 | HR 84 | Ht 63.0 in | Wt 239.0 lb

## 2018-10-05 DIAGNOSIS — Z6841 Body Mass Index (BMI) 40.0 and over, adult: Secondary | ICD-10-CM

## 2018-10-05 DIAGNOSIS — Z13220 Encounter for screening for lipoid disorders: Secondary | ICD-10-CM

## 2018-10-05 DIAGNOSIS — Z1329 Encounter for screening for other suspected endocrine disorder: Secondary | ICD-10-CM

## 2018-10-05 DIAGNOSIS — Z131 Encounter for screening for diabetes mellitus: Secondary | ICD-10-CM

## 2018-10-05 DIAGNOSIS — F3281 Premenstrual dysphoric disorder: Secondary | ICD-10-CM

## 2018-10-05 MED ORDER — ETONOGESTREL-ETHINYL ESTRADIOL 0.12-0.015 MG/24HR VA RING: VAGINAL_RING | VAGINAL | 11 refills | Status: DC

## 2018-10-05 MED ORDER — PHENTERMINE HCL 37.5 MG PO TABS: 37.5000 mg | ORAL_TABLET | Freq: Every day | ORAL | 0 refills | Status: DC
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
## 2018-10-06 LAB — HEMOGLOBIN A1C
ESTIMATED AVERAGE GLUCOSE: 117 mg/dL
Hgb A1c MFr Bld: 5.7 % — ABNORMAL HIGH (ref 4.8–5.6)

## 2018-10-06 LAB — LIPID PANEL
CHOL/HDL RATIO: 4.1 ratio (ref 0.0–4.4)
Cholesterol, Total: 191 mg/dL (ref 100–199)
HDL: 47 mg/dL (ref >39)
LDL Calculated: 90 mg/dL (ref 0–99)
Triglycerides: 272 mg/dL — ABNORMAL HIGH (ref 0–149)
VLDL Cholesterol Cal: 54 mg/dL — ABNORMAL HIGH (ref 5–40)

## 2018-10-06 LAB — TSH: TSH: 0.61 u[IU]/mL (ref 0.450–4.500)

## 2018-11-12 ENCOUNTER — Encounter: Payer: Self-pay | Admitting: Obstetrics and Gynecology

## 2018-11-12 ENCOUNTER — Ambulatory Visit (INDEPENDENT_AMBULATORY_CARE_PROVIDER_SITE_OTHER): Payer: BLUE CROSS/BLUE SHIELD | Admitting: Obstetrics and Gynecology

## 2018-11-12 ENCOUNTER — Telehealth: Payer: Self-pay

## 2018-11-12 VITALS — BP 142/92 | Wt 241.0 lb

## 2018-11-12 DIAGNOSIS — Z6841 Body Mass Index (BMI) 40.0 and over, adult: Secondary | ICD-10-CM | POA: Diagnosis not present

## 2018-11-12 MED ORDER — LORCASERIN HCL ER 20 MG PO TB24: 1.0000 | ORAL_TABLET | Freq: Every day | ORAL | 2 refills | Status: DC
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
## 2018-11-12 NOTE — Telephone Encounter (Signed)
Please advise 

## 2018-11-12 NOTE — Telephone Encounter (Signed)
Sean calling from [facility] in [place] is recalled and no longer going to be available.  They can't even type it into their system.  Any questions, please call [phone]

## 2018-11-13 ENCOUNTER — Ambulatory Visit: Payer: BLUE CROSS/BLUE SHIELD | Admitting: Obstetrics and Gynecology

## 2018-11-16 NOTE — Telephone Encounter (Signed)
Left message to drop by the office sometime this week and I'll do saxenda teaching does not need appointment

## 2018-11-20 ENCOUNTER — Other Ambulatory Visit: Payer: Self-pay | Admitting: Obstetrics and Gynecology

## 2018-11-20 ENCOUNTER — Ambulatory Visit: Payer: BLUE CROSS/BLUE SHIELD | Admitting: Obstetrics and Gynecology

## 2018-11-20 MED ORDER — INSULIN PEN NEEDLE 32G X 6 MM MISC: 1.0000 [IU] | Freq: Every day | 3 refills | Status: DC

## 2018-11-20 MED ORDER — LIRAGLUTIDE -WEIGHT MANAGEMENT 18 MG/3ML ~~LOC~~ SOPN: 3.0000 mg | PEN_INJECTOR | Freq: Every day | SUBCUTANEOUS | 2 refills | Status: DC

## 2018-12-02 ENCOUNTER — Encounter: Payer: Self-pay | Admitting: Emergency Medicine

## 2018-12-02 ENCOUNTER — Ambulatory Visit
Admission: EM | Admit: 2018-12-02 | Discharge: 2018-12-02 | Disposition: A | Discharge status: Home / Self Care | Payer: BLUE CROSS/BLUE SHIELD | Attending: Family Medicine | Admitting: Family Medicine

## 2018-12-02 ENCOUNTER — Other Ambulatory Visit: Payer: Self-pay

## 2018-12-02 DIAGNOSIS — J069 Acute upper respiratory infection, unspecified: Secondary | ICD-10-CM

## 2018-12-02 DIAGNOSIS — J029 Acute pharyngitis, unspecified: Secondary | ICD-10-CM | POA: Diagnosis not present

## 2018-12-02 DIAGNOSIS — F17210 Nicotine dependence, cigarettes, uncomplicated: Secondary | ICD-10-CM

## 2018-12-02 LAB — RAPID STREP SCREEN (MED CTR MEBANE ONLY): Streptococcus, Group A Screen (Direct): NEGATIVE

## 2018-12-02 MED ORDER — LIDOCAINE VISCOUS HCL 2 % MT SOLN: OROMUCOSAL | 0 refills | Status: DC
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
## 2018-12-02 NOTE — ED Triage Notes (Signed)
Patient in today c/o slight cough, sore throat, nasal congestion and bilateral ear pain L>R since this morning. Patient had emesis x 1 yesterday. Patient has tried OTC allergy medicine. Patient had fever of 99.8 this evening.

## 2018-12-05 LAB — CULTURE, GROUP A STREP (THRC)
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
## 2019-02-24 ENCOUNTER — Other Ambulatory Visit (HOSPITAL_COMMUNITY)
Admission: RE | Admit: 2019-02-24 | Discharge: 2019-02-24 | Disposition: A | Discharge status: Home / Self Care | Payer: BC Managed Care – PPO | Source: Ambulatory Visit | Attending: Obstetrics and Gynecology | Admitting: Obstetrics and Gynecology

## 2019-02-24 ENCOUNTER — Encounter: Payer: Self-pay | Admitting: Obstetrics and Gynecology

## 2019-02-24 ENCOUNTER — Other Ambulatory Visit: Payer: Self-pay

## 2019-02-24 ENCOUNTER — Ambulatory Visit (INDEPENDENT_AMBULATORY_CARE_PROVIDER_SITE_OTHER): Payer: BC Managed Care – PPO | Admitting: Obstetrics and Gynecology

## 2019-02-24 VITALS — BP 136/87 | HR 90 | Ht 63.0 in | Wt 251.0 lb

## 2019-02-24 DIAGNOSIS — R102 Pelvic and perineal pain: Secondary | ICD-10-CM | POA: Diagnosis not present

## 2019-02-24 DIAGNOSIS — N939 Abnormal uterine and vaginal bleeding, unspecified: Secondary | ICD-10-CM

## 2019-02-24 DIAGNOSIS — Z124 Encounter for screening for malignant neoplasm of cervix: Secondary | ICD-10-CM | POA: Diagnosis not present

## 2019-02-24 DIAGNOSIS — Z113 Encounter for screening for infections with a predominantly sexual mode of transmission: Secondary | ICD-10-CM

## 2019-02-24 DIAGNOSIS — R350 Frequency of micturition: Secondary | ICD-10-CM

## 2019-02-25 LAB — THYROID PANEL WITH TSH
Free Thyroxine Index: 1.9 (ref 1.2–4.9)
T3 Uptake Ratio: 18 % — ABNORMAL LOW (ref 24–39)
T4, Total: 10.7 ug/dL (ref 4.5–12.0)
TSH: 0.794 u[IU]/mL (ref 0.450–4.500)

## 2019-02-25 LAB — BETA HCG QUANT (REF LAB): hCG Quant: <1 m[IU]/mL

## 2019-02-25 LAB — PROLACTIN: Prolactin: 20.7 ng/mL (ref 4.8–23.3)

## 2019-02-26 ENCOUNTER — Telehealth: Payer: Self-pay

## 2019-02-26 LAB — URINE CULTURE

## 2019-02-26 NOTE — Telephone Encounter (Signed)
Pt calling for blood and urine results.  [phone]

## 2019-02-26 NOTE — Telephone Encounter (Signed)
His note is not done so do not know what he was looking for w labs. Tell her no urinary infection found and pregnancy hormone test was negative. He will have to call back regarding the other labs.

## 2019-02-26 NOTE — Telephone Encounter (Signed)
Pt aware of PH's adv.  Can wait for AMS.

## 2019-03-01 LAB — CYTOLOGY - PAP
Chlamydia: NEGATIVE
Diagnosis: NEGATIVE
Neisseria Gonorrhea: NEGATIVE
Trichomonas: NEGATIVE

## 2019-03-09 ENCOUNTER — Other Ambulatory Visit: Payer: Self-pay

## 2019-03-09 ENCOUNTER — Ambulatory Visit (INDEPENDENT_AMBULATORY_CARE_PROVIDER_SITE_OTHER): Payer: BC Managed Care – PPO | Admitting: Obstetrics and Gynecology

## 2019-03-09 ENCOUNTER — Ambulatory Visit (INDEPENDENT_AMBULATORY_CARE_PROVIDER_SITE_OTHER): Payer: BC Managed Care – PPO

## 2019-03-09 ENCOUNTER — Encounter: Payer: Self-pay | Admitting: Obstetrics and Gynecology

## 2019-03-09 VITALS — BP 132/86 | HR 83 | Ht 63.0 in | Wt 249.0 lb

## 2019-03-09 DIAGNOSIS — N939 Abnormal uterine and vaginal bleeding, unspecified: Secondary | ICD-10-CM

## 2019-03-09 DIAGNOSIS — N83201 Unspecified ovarian cyst, right side: Secondary | ICD-10-CM

## 2019-03-09 DIAGNOSIS — R102 Pelvic and perineal pain: Secondary | ICD-10-CM | POA: Diagnosis not present

## 2019-03-09 DIAGNOSIS — G8929 Other chronic pain: Secondary | ICD-10-CM | POA: Diagnosis not present
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
## 2019-03-10 ENCOUNTER — Other Ambulatory Visit: Payer: Self-pay | Admitting: Obstetrics and Gynecology

## 2019-03-10 NOTE — Telephone Encounter (Signed)
advise

## 2019-03-15 ENCOUNTER — Telehealth: Payer: Self-pay | Admitting: Obstetrics and Gynecology

## 2019-03-15 NOTE — Telephone Encounter (Signed)
Patient is aware of H&P at [facility] on 04/27/19 @ 4:10pm w/ Dr. Georgianne Fick, Pre-admit testing and COVID testing to be scheduled, and OR on 05/06/19. Patient is aware she will be asked to quarantine after COVID testing. Patient is aware she may receive calls from the [facility] and [facility]. Patient confirmed BCBS and no secondary insurance. Patient is aware of phone check-in/ mask/ no visitors at [place]. Patient is aware of no visitors at [facility].

## 2019-03-15 NOTE — Telephone Encounter (Signed)
-----   Message from [name], MD sent at 03/09/2019  8:09 PM EDT ----- Regarding: Surgery Level 3 Surgery Date:   LOS: observation  Surgery Booking Request Patient Full Name: Julie Estrada MRN: [id number]  DOB: [birth year]  Surgeon: [name], MD  Requested Surgery Date and Time: 2 months Primary Diagnosis and Code: CHronic pelvic pain Secondary Diagnosis and Code:  Surgical Procedure: TLH/BS and cystoscopy L&D Notification:N/A Admission Status: observation Length of Surgery: 2hrs Special Case Needs: none H&P: week prior(date) Phone Interview or Office Pre-Admit: pre-admit Interpreter: No Language: English Medical Clearance: No Special Scheduling Instructions: Level 3

## 2019-03-15 NOTE — Telephone Encounter (Signed)
Pt calling; works for a surgical center; they need date of surgery asap so they can get a replacement for her ahead of time; would like to schedule hyst.  [phone]

## 2019-04-27 ENCOUNTER — Other Ambulatory Visit: Payer: Self-pay

## 2019-04-27 ENCOUNTER — Ambulatory Visit (INDEPENDENT_AMBULATORY_CARE_PROVIDER_SITE_OTHER): Payer: BC Managed Care – PPO | Admitting: Obstetrics and Gynecology

## 2019-04-27 ENCOUNTER — Encounter: Payer: Self-pay | Admitting: Obstetrics and Gynecology

## 2019-04-27 VITALS — BP 140/78 | Ht 63.0 in | Wt 248.0 lb

## 2019-04-27 DIAGNOSIS — R102 Pelvic and perineal pain: Secondary | ICD-10-CM

## 2019-04-27 DIAGNOSIS — Z01818 Encounter for other preprocedural examination: Secondary | ICD-10-CM

## 2019-04-27 DIAGNOSIS — N939 Abnormal uterine and vaginal bleeding, unspecified: Secondary | ICD-10-CM
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
## 2019-04-30 ENCOUNTER — Encounter
Admission: RE | Admit: 2019-04-30 | Discharge: 2019-04-30 | Disposition: A | Discharge status: Home / Self Care | Payer: BC Managed Care – PPO | Source: Ambulatory Visit | Attending: Obstetrics and Gynecology | Admitting: Obstetrics and Gynecology

## 2019-04-30 ENCOUNTER — Other Ambulatory Visit: Payer: Self-pay

## 2019-04-30 ENCOUNTER — Inpatient Hospital Stay: Admission: RE | Admit: 2019-04-30 | Payer: BC Managed Care – PPO | Source: Ambulatory Visit

## 2019-04-30 HISTORY — DX: Family history of other specified conditions: Z84.89

## 2019-04-30 HISTORY — DX: Nausea with vomiting, unspecified: R11.2

## 2019-04-30 HISTORY — DX: Pneumonia, unspecified organism: J18.9

## 2019-04-30 HISTORY — DX: Other specified postprocedural states: Z98.890

## 2019-04-30 NOTE — Patient Instructions (Addendum)
Your procedure is scheduled on: Thursday, May 06, 2019 Report to Day Surgery on the 2nd floor of the [facility]. To find out your arrival time, please call [phone] between 1PM - 3PM on: Wednesday, August 12  REMEMBER: Instructions that are not followed completely may result in serious medical risk, up to and including death; or upon the discretion of your surgeon and anesthesiologist your surgery may need to be rescheduled.  Do not eat food after midnight the night before surgery.  No gum chewing, lozengers or hard candies.  You may however, drink CLEAR liquids up to 2 hours before you are scheduled to arrive for your surgery. Do not drink anything within 2 hours of the start of your surgery.  Clear liquids include: - water  - apple juice without pulp - gatorade - black coffee or tea (Do NOT add milk or creamers to the coffee or tea) Do NOT drink anything that is not on this list.  No Alcohol for 24 hours before or after surgery.  No Smoking including e-cigarettes for 24 hours prior to surgery.  No chewable tobacco products for at least 6 hours prior to surgery.  No nicotine patches on the day of surgery.  On the morning of surgery brush your teeth with toothpaste and water, you may rinse your mouth with mouthwash if you wish. Do not swallow any toothpaste or mouthwash.  Notify your doctor if there is any change in your medical condition (cold, fever, infection).  Do not wear jewelry, make-up, hairpins, clips or nail polish.  Do not wear lotions, powders, or perfumes.   Do not shave 48 hours prior to surgery.   Contacts and dentures may not be worn into surgery.  Do not bring valuables to the hospital, including drivers license, insurance or credit cards.  [facility] is not responsible for any belongings or valuables.   TAKE THESE MEDICATIONS THE MORNING OF SURGERY:  1.  Tylenol if needed for pain  Use CHG Soap as directed on instruction sheet.  NOW!  Stop  Anti-inflammatories (NSAIDS) such as Advil, Aleve, Ibuprofen, Motrin, Naproxen, Naprosyn and Aspirin based products such as Excedrin, Goodys Powder, BC Powder. (May take Tylenol or Acetaminophen if needed.)  NOW!  Stop ANY OVER THE COUNTER supplements until after surgery.  Wear comfortable clothing (specific to your surgery type) to the hospital.  If you are being admitted to the hospital overnight, leave your suitcase in the car. After surgery it may be brought to your room.  Please call [phone] if you have any questions about these instructions.

## 2019-05-03 ENCOUNTER — Other Ambulatory Visit: Payer: Self-pay

## 2019-05-03 ENCOUNTER — Other Ambulatory Visit
Admission: RE | Admit: 2019-05-03 | Discharge: 2019-05-03 | Disposition: A | Discharge status: Home / Self Care | Payer: BC Managed Care – PPO | Source: Ambulatory Visit | Attending: Obstetrics and Gynecology | Admitting: Obstetrics and Gynecology

## 2019-05-03 DIAGNOSIS — Z01812 Encounter for preprocedural laboratory examination: Secondary | ICD-10-CM | POA: Diagnosis present

## 2019-05-03 DIAGNOSIS — Z20828 Contact with and (suspected) exposure to other viral communicable diseases: Secondary | ICD-10-CM | POA: Diagnosis not present

## 2019-05-03 LAB — BASIC METABOLIC PANEL
Anion gap: 10 (ref 5–15)
BUN: 14 mg/dL (ref 6–20)
CO2: 23 mmol/L (ref 22–32)
Calcium: 8.7 mg/dL — ABNORMAL LOW (ref 8.9–10.3)
Chloride: 103 mmol/L (ref 98–111)
Creatinine, Ser: 0.5 mg/dL (ref 0.44–1.00)
GFR calc Af Amer: >60 mL/min (ref >60)
GFR calc non Af Amer: >60 mL/min (ref >60)
Glucose, Bld: 93 mg/dL (ref 70–99)
Potassium: 3.8 mmol/L (ref 3.5–5.1)
Sodium: 136 mmol/L (ref 135–145)

## 2019-05-03 LAB — CBC
HCT: 40.5 % (ref 36.0–46.0)
Hemoglobin: 12.9 g/dL (ref 12.0–15.0)
MCH: 26.3 pg (ref 26.0–34.0)
MCHC: 31.9 g/dL (ref 30.0–36.0)
MCV: 82.7 fL (ref 80.0–100.0)
Platelets: 205 10*3/uL (ref 150–400)
RBC: 4.9 MIL/uL (ref 3.87–5.11)
RDW: 12.9 % (ref 11.5–15.5)
WBC: 5.5 10*3/uL (ref 4.0–10.5)
nRBC: 0 % (ref 0.0–0.2)

## 2019-05-03 LAB — SARS CORONAVIRUS 2 (TAT 6-24 HRS): SARS Coronavirus 2: NEGATIVE

## 2019-05-03 LAB — TYPE AND SCREEN
ABO/RH(D): A POS
Antibody Screen: NEGATIVE

## 2019-05-06 ENCOUNTER — Encounter: Payer: Self-pay | Admitting: Anesthesiology

## 2019-05-06 ENCOUNTER — Observation Stay
Admission: RE | Admit: 2019-05-06 | Discharge: 2019-05-07 | Disposition: A | Discharge status: Home / Self Care | Payer: BC Managed Care – PPO | Attending: Obstetrics and Gynecology | Admitting: Obstetrics and Gynecology

## 2019-05-06 ENCOUNTER — Encounter
Admission: RE | Disposition: A | Discharge status: Home / Self Care | Payer: Self-pay | Source: Home / Self Care | Attending: Obstetrics and Gynecology

## 2019-05-06 ENCOUNTER — Other Ambulatory Visit: Payer: Self-pay

## 2019-05-06 ENCOUNTER — Ambulatory Visit: Payer: BC Managed Care – PPO | Admitting: Certified Registered Nurse Anesthetist

## 2019-05-06 DIAGNOSIS — E119 Type 2 diabetes mellitus without complications: Secondary | ICD-10-CM | POA: Diagnosis not present

## 2019-05-06 DIAGNOSIS — Z6841 Body Mass Index (BMI) 40.0 and over, adult: Secondary | ICD-10-CM | POA: Diagnosis not present

## 2019-05-06 DIAGNOSIS — Z79899 Other long term (current) drug therapy: Secondary | ICD-10-CM | POA: Diagnosis not present

## 2019-05-06 DIAGNOSIS — G8929 Other chronic pain: Secondary | ICD-10-CM | POA: Insufficient documentation

## 2019-05-06 DIAGNOSIS — Z90710 Acquired absence of both cervix and uterus: Secondary | ICD-10-CM | POA: Diagnosis present

## 2019-05-06 DIAGNOSIS — Z880 Allergy status to penicillin: Secondary | ICD-10-CM | POA: Diagnosis not present

## 2019-05-06 DIAGNOSIS — F17210 Nicotine dependence, cigarettes, uncomplicated: Secondary | ICD-10-CM | POA: Diagnosis not present

## 2019-05-06 DIAGNOSIS — N736 Female pelvic peritoneal adhesions (postinfective): Secondary | ICD-10-CM | POA: Insufficient documentation

## 2019-05-06 DIAGNOSIS — N838 Other noninflammatory disorders of ovary, fallopian tube and broad ligament: Secondary | ICD-10-CM

## 2019-05-06 DIAGNOSIS — F419 Anxiety disorder, unspecified: Secondary | ICD-10-CM | POA: Insufficient documentation

## 2019-05-06 DIAGNOSIS — R102 Pelvic and perineal pain: Secondary | ICD-10-CM | POA: Insufficient documentation

## 2019-05-06 DIAGNOSIS — N72 Inflammatory disease of cervix uteri: Secondary | ICD-10-CM

## 2019-05-06 DIAGNOSIS — Z885 Allergy status to narcotic agent status: Secondary | ICD-10-CM | POA: Insufficient documentation

## 2019-05-06 DIAGNOSIS — I10 Essential (primary) hypertension: Secondary | ICD-10-CM | POA: Insufficient documentation

## 2019-05-06 DIAGNOSIS — N939 Abnormal uterine and vaginal bleeding, unspecified: Secondary | ICD-10-CM | POA: Diagnosis present

## 2019-05-06 DIAGNOSIS — Z881 Allergy status to other antibiotic agents status: Secondary | ICD-10-CM | POA: Diagnosis not present

## 2019-05-06 DIAGNOSIS — N938 Other specified abnormal uterine and vaginal bleeding: Secondary | ICD-10-CM

## 2019-05-06 DIAGNOSIS — G894 Chronic pain syndrome: Secondary | ICD-10-CM

## 2019-05-06 HISTORY — PX: CYSTOSCOPY: SHX5120

## 2019-05-06 HISTORY — PX: TOTAL LAPAROSCOPIC HYSTERECTOMY WITH BILATERAL SALPINGO OOPHORECTOMY: SHX6845

## 2019-05-06 LAB — POCT PREGNANCY, URINE: Preg Test, Ur: NEGATIVE

## 2019-05-06 SURGERY — HYSTERECTOMY, TOTAL, LAPAROSCOPIC, WITH BILATERAL SALPINGO-OOPHORECTOMY
Anesthesia: General

## 2019-05-06 MED ORDER — SUGAMMADEX SODIUM 200 MG/2ML IV SOLN
INTRAVENOUS | Status: AC
  Filled 2019-05-06: qty 2

## 2019-05-06 MED ORDER — PHENYLEPHRINE HCL (PRESSORS) 10 MG/ML IV SOLN
INTRAVENOUS | Status: DC | PRN
  Administered 2019-05-06: 100 ug via INTRAVENOUS

## 2019-05-06 MED ORDER — KETOROLAC TROMETHAMINE 30 MG/ML IJ SOLN
INTRAMUSCULAR | Status: DC | PRN
  Administered 2019-05-06: 30 mg via INTRAVENOUS

## 2019-05-06 MED ORDER — ROCURONIUM BROMIDE 50 MG/5ML IV SOLN
INTRAVENOUS | Status: AC
  Filled 2019-05-06: qty 1

## 2019-05-06 MED ORDER — LIDOCAINE HCL (CARDIAC) PF 100 MG/5ML IV SOSY
PREFILLED_SYRINGE | INTRAVENOUS | Status: DC | PRN
  Administered 2019-05-06: 50 mg via INTRAVENOUS

## 2019-05-06 MED ORDER — VENLAFAXINE HCL ER 150 MG PO CP24
150.0000 mg | ORAL_CAPSULE | Freq: Every day | ORAL | Status: DC
  Administered 2019-05-06: 150 mg via ORAL
  Filled 2019-05-06 (×2): qty 1

## 2019-05-06 MED ORDER — MIDAZOLAM HCL 2 MG/2ML IJ SOLN
INTRAMUSCULAR | Status: AC
  Filled 2019-05-06: qty 2

## 2019-05-06 MED ORDER — FENTANYL CITRATE (PF) 100 MCG/2ML IJ SOLN
25.0000 ug | INTRAMUSCULAR | Status: AC | PRN
  Administered 2019-05-06 (×6): 25 ug via INTRAVENOUS

## 2019-05-06 MED ORDER — ACETAMINOPHEN 10 MG/ML IV SOLN
INTRAVENOUS | Status: DC | PRN
  Administered 2019-05-06: 1000 mg via INTRAVENOUS

## 2019-05-06 MED ORDER — GLYCOPYRROLATE 0.2 MG/ML IJ SOLN
INTRAMUSCULAR | Status: AC
  Filled 2019-05-06: qty 1

## 2019-05-06 MED ORDER — PROPOFOL 10 MG/ML IV BOLUS
INTRAVENOUS | Status: DC | PRN
  Administered 2019-05-06: 200 mg via INTRAVENOUS

## 2019-05-06 MED ORDER — HYDROMORPHONE HCL 1 MG/ML IJ SOLN
0.2000 mg | INTRAMUSCULAR | Status: DC | PRN
  Administered 2019-05-06 – 2019-05-07 (×5): 0.4 mg via INTRAVENOUS
  Filled 2019-05-06 (×5): qty 1

## 2019-05-06 MED ORDER — KETOROLAC TROMETHAMINE 30 MG/ML IJ SOLN
INTRAMUSCULAR | Status: AC
  Filled 2019-05-06: qty 1

## 2019-05-06 MED ORDER — KETOROLAC TROMETHAMINE 30 MG/ML IJ SOLN
30.0000 mg | Freq: Four times a day (QID) | INTRAMUSCULAR | Status: DC | PRN
  Administered 2019-05-06 – 2019-05-07 (×2): 30 mg via INTRAVENOUS
  Filled 2019-05-06 (×2): qty 1

## 2019-05-06 MED ORDER — HEMOSTATIC AGENTS (NO CHARGE) OPTIME
TOPICAL | Status: DC | PRN
  Administered 2019-05-06: 1 via TOPICAL

## 2019-05-06 MED ORDER — FENTANYL CITRATE (PF) 100 MCG/2ML IJ SOLN
INTRAMUSCULAR | Status: DC | PRN
  Administered 2019-05-06 (×7): 50 ug via INTRAVENOUS

## 2019-05-06 MED ORDER — FAMOTIDINE 20 MG PO TABS
20.0000 mg | ORAL_TABLET | Freq: Once | ORAL | Status: AC
  Administered 2019-05-06: 20 mg via ORAL

## 2019-05-06 MED ORDER — OXYCODONE HCL 5 MG PO TABS
ORAL_TABLET | ORAL | Status: AC
  Filled 2019-05-06: qty 1

## 2019-05-06 MED ORDER — PROPOFOL 10 MG/ML IV BOLUS
INTRAVENOUS | Status: AC
  Filled 2019-05-06: qty 20

## 2019-05-06 MED ORDER — FENTANYL CITRATE (PF) 100 MCG/2ML IJ SOLN
INTRAMUSCULAR | Status: AC
  Administered 2019-05-06: 25 ug via INTRAVENOUS
  Filled 2019-05-06: qty 2

## 2019-05-06 MED ORDER — ONDANSETRON HCL 4 MG/2ML IJ SOLN: 4.0000 mg | Freq: Once | INTRAMUSCULAR | Status: DC | PRN

## 2019-05-06 MED ORDER — SUGAMMADEX SODIUM 200 MG/2ML IV SOLN
INTRAVENOUS | Status: DC | PRN
  Administered 2019-05-06: 450 mg via INTRAVENOUS

## 2019-05-06 MED ORDER — SUCCINYLCHOLINE CHLORIDE 20 MG/ML IJ SOLN
INTRAMUSCULAR | Status: DC | PRN
  Administered 2019-05-06: 100 mg via INTRAVENOUS

## 2019-05-06 MED ORDER — FENTANYL CITRATE (PF) 100 MCG/2ML IJ SOLN
INTRAMUSCULAR | Status: AC
  Filled 2019-05-06: qty 2

## 2019-05-06 MED ORDER — CEFAZOLIN SODIUM-DEXTROSE 2-4 GM/100ML-% IV SOLN
2.0000 g | INTRAVENOUS | Status: AC
  Administered 2019-05-06: 2 g via INTRAVENOUS

## 2019-05-06 MED ORDER — MENTHOL 3 MG MT LOZG
1.0000 | LOZENGE | OROMUCOSAL | Status: DC | PRN
  Filled 2019-05-06: qty 9

## 2019-05-06 MED ORDER — ONDANSETRON HCL 4 MG PO TABS: 4.0000 mg | ORAL_TABLET | Freq: Four times a day (QID) | ORAL | Status: DC | PRN

## 2019-05-06 MED ORDER — FENTANYL CITRATE (PF) 250 MCG/5ML IJ SOLN
INTRAMUSCULAR | Status: AC
  Filled 2019-05-06: qty 5

## 2019-05-06 MED ORDER — SCOPOLAMINE 1 MG/3DAYS TD PT72
1.0000 | MEDICATED_PATCH | Freq: Once | TRANSDERMAL | Status: DC
  Administered 2019-05-06: 1.5 mg via TRANSDERMAL

## 2019-05-06 MED ORDER — DEXAMETHASONE SODIUM PHOSPHATE 10 MG/ML IJ SOLN
INTRAMUSCULAR | Status: DC | PRN
  Administered 2019-05-06: 10 mg via INTRAVENOUS

## 2019-05-06 MED ORDER — CEFAZOLIN SODIUM-DEXTROSE 2-4 GM/100ML-% IV SOLN
INTRAVENOUS | Status: AC
  Filled 2019-05-06: qty 100

## 2019-05-06 MED ORDER — OXYCODONE-ACETAMINOPHEN 5-325 MG PO TABS: 1.0000 | ORAL_TABLET | ORAL | 0 refills | Status: DC | PRN

## 2019-05-06 MED ORDER — ONDANSETRON HCL 4 MG/2ML IJ SOLN
INTRAMUSCULAR | Status: DC | PRN
  Administered 2019-05-06: 4 mg via INTRAVENOUS

## 2019-05-06 MED ORDER — MIDAZOLAM HCL 2 MG/2ML IJ SOLN
INTRAMUSCULAR | Status: DC | PRN
  Administered 2019-05-06: 2 mg via INTRAVENOUS

## 2019-05-06 MED ORDER — GLYCOPYRROLATE 0.2 MG/ML IJ SOLN
INTRAMUSCULAR | Status: DC | PRN
  Administered 2019-05-06: 0.2 mg via INTRAVENOUS

## 2019-05-06 MED ORDER — DEXAMETHASONE SODIUM PHOSPHATE 10 MG/ML IJ SOLN
INTRAMUSCULAR | Status: AC
  Filled 2019-05-06: qty 1

## 2019-05-06 MED ORDER — SUGAMMADEX SODIUM 500 MG/5ML IV SOLN
INTRAVENOUS | Status: AC
  Filled 2019-05-06: qty 5

## 2019-05-06 MED ORDER — IBUPROFEN 200 MG PO TABS: 800.0000 mg | ORAL_TABLET | Freq: Three times a day (TID) | ORAL | 0 refills | Status: DC | PRN

## 2019-05-06 MED ORDER — LACTATED RINGERS IV SOLN
INTRAVENOUS | Status: DC
  Administered 2019-05-06: 09:00:00 via INTRAVENOUS

## 2019-05-06 MED ORDER — DEXTROSE-NACL 5-0.45 % IV SOLN
INTRAVENOUS | Status: DC
  Administered 2019-05-06: 17:00:00 via INTRAVENOUS

## 2019-05-06 MED ORDER — ONDANSETRON HCL 4 MG/2ML IJ SOLN
INTRAMUSCULAR | Status: AC
  Filled 2019-05-06: qty 2

## 2019-05-06 MED ORDER — OXYCODONE-ACETAMINOPHEN 5-325 MG PO TABS
1.0000 | ORAL_TABLET | ORAL | Status: DC | PRN
  Administered 2019-05-07 (×2): 2 via ORAL
  Filled 2019-05-06 (×2): qty 2

## 2019-05-06 MED ORDER — LIDOCAINE HCL 4 % EX SOLN
CUTANEOUS | Status: DC | PRN
  Administered 2019-05-06: 3 mL via TOPICAL

## 2019-05-06 MED ORDER — ONDANSETRON 4 MG PO TBDP: 4.0000 mg | ORAL_TABLET | Freq: Four times a day (QID) | ORAL | 0 refills | Status: DC | PRN

## 2019-05-06 MED ORDER — ONDANSETRON HCL 4 MG/2ML IJ SOLN: 4.0000 mg | Freq: Four times a day (QID) | INTRAMUSCULAR | Status: DC | PRN

## 2019-05-06 MED ORDER — SCOPOLAMINE 1 MG/3DAYS TD PT72
MEDICATED_PATCH | TRANSDERMAL | Status: AC
  Administered 2019-05-06: 09:00:00 1.5 mg via TRANSDERMAL
  Filled 2019-05-06: qty 1

## 2019-05-06 MED ORDER — OXYCODONE HCL 5 MG PO TABS
5.0000 mg | ORAL_TABLET | Freq: Once | ORAL | Status: AC
  Administered 2019-05-06: 5 mg via ORAL

## 2019-05-06 MED ORDER — FAMOTIDINE 20 MG PO TABS
ORAL_TABLET | ORAL | Status: AC
  Administered 2019-05-06: 09:00:00 20 mg via ORAL
  Filled 2019-05-06: qty 1

## 2019-05-06 MED ORDER — ROCURONIUM BROMIDE 100 MG/10ML IV SOLN
INTRAVENOUS | Status: DC | PRN
  Administered 2019-05-06: 5 mg via INTRAVENOUS
  Administered 2019-05-06: 30 mg via INTRAVENOUS
  Administered 2019-05-06: 20 mg via INTRAVENOUS
  Administered 2019-05-06: 45 mg via INTRAVENOUS

## 2019-05-06 MED ORDER — SUCCINYLCHOLINE CHLORIDE 20 MG/ML IJ SOLN
INTRAMUSCULAR | Status: AC
  Filled 2019-05-06: qty 1

## 2019-05-06 MED ORDER — LIDOCAINE HCL (PF) 2 % IJ SOLN
INTRAMUSCULAR | Status: AC
  Filled 2019-05-06: qty 10

## 2019-05-06 MED ORDER — BUPIVACAINE HCL 0.5 % IJ SOLN
INTRAMUSCULAR | Status: DC | PRN
  Administered 2019-05-06: 16 mL
  Administered 2019-05-06: 10 mL

## 2019-05-06 SURGICAL SUPPLY — 57 items
APPLICATOR ARISTA FLEXITIP XL (MISCELLANEOUS) ×1 IMPLANT
BAG URINE DRAINAGE (UROLOGICAL SUPPLIES) ×2 IMPLANT
BLADE SURG SZ11 CARB STEEL (BLADE) ×2 IMPLANT
CANISTER SUCT 1200ML W/VALVE (MISCELLANEOUS) ×2 IMPLANT
CATH FOLEY 2WAY  5CC 16FR (CATHETERS) ×1
CATH URTH 16FR FL 2W BLN LF (CATHETERS) ×1 IMPLANT
CHLORAPREP W/TINT 26 (MISCELLANEOUS) ×2 IMPLANT
COVER WAND RF STERILE (DRAPES) ×2 IMPLANT
DEFOGGER SCOPE WARMER CLEARIFY (MISCELLANEOUS) ×2 IMPLANT
DERMABOND ADVANCED (GAUZE/BANDAGES/DRESSINGS) ×1
DERMABOND ADVANCED .7 DNX12 (GAUZE/BANDAGES/DRESSINGS) ×1 IMPLANT
DEVICE SUTURE ENDOST 10MM (ENDOMECHANICALS) ×2 IMPLANT
DRESSING SURGICEL FIBRLLR 1X2 (HEMOSTASIS) IMPLANT
DRSG SURGICEL FIBRILLAR 1X2 (HEMOSTASIS) ×2
GAUZE 4X4 16PLY RFD (DISPOSABLE) ×2 IMPLANT
GLOVE BIO SURGEON STRL SZ7 (GLOVE) ×8 IMPLANT
GLOVE INDICATOR 7.5 STRL GRN (GLOVE) ×2 IMPLANT
GOWN STRL REUS W/ TWL LRG LVL3 (GOWN DISPOSABLE) ×2 IMPLANT
GOWN STRL REUS W/ TWL XL LVL3 (GOWN DISPOSABLE) ×1 IMPLANT
GOWN STRL REUS W/TWL LRG LVL3 (GOWN DISPOSABLE) ×2
GOWN STRL REUS W/TWL XL LVL3 (GOWN DISPOSABLE) ×1
GRASPER SUT TROCAR 14GX15 (MISCELLANEOUS) ×2 IMPLANT
HEMOSTAT ARISTA ABSORB 3G PWDR (HEMOSTASIS) ×1 IMPLANT
IRRIGATION STRYKERFLOW (MISCELLANEOUS) ×1 IMPLANT
IRRIGATOR STRYKERFLOW (MISCELLANEOUS)
IV LACTATED RINGERS 1000ML (IV SOLUTION) ×2 IMPLANT
IV NS 1000ML (IV SOLUTION) ×1
IV NS 1000ML BAXH (IV SOLUTION) ×1 IMPLANT
KIT PINK PAD W/HEAD ARE REST (MISCELLANEOUS) ×2
KIT PINK PAD W/HEAD ARM REST (MISCELLANEOUS) ×1 IMPLANT
LABEL OR SOLS (LABEL) ×2 IMPLANT
MANIPULATOR VCARE LG CRV RETR (MISCELLANEOUS) IMPLANT
MANIPULATOR VCARE SML CRV RETR (MISCELLANEOUS) IMPLANT
MANIPULATOR VCARE STD CRV RETR (MISCELLANEOUS) ×1 IMPLANT
NS IRRIG 1000ML POUR BTL (IV SOLUTION) ×2 IMPLANT
NS IRRIG 500ML POUR BTL (IV SOLUTION) ×2 IMPLANT
OCCLUDER COLPOPNEUMO (BALLOONS) ×1 IMPLANT
PACK GYN LAPAROSCOPIC (MISCELLANEOUS) ×2 IMPLANT
PAD OB MATERNITY 4.3X12.25 (PERSONAL CARE ITEMS) ×2 IMPLANT
PAD PREP 24X41 OB/GYN DISP (PERSONAL CARE ITEMS) ×2 IMPLANT
SCISSORS METZENBAUM CVD 33 (INSTRUMENTS) ×2 IMPLANT
SET CYSTO W/LG BORE CLAMP LF (SET/KITS/TRAYS/PACK) ×2 IMPLANT
SHEARS HARMONIC ACE PLUS 36CM (ENDOMECHANICALS) ×2 IMPLANT
SLEEVE ENDOPATH XCEL 5M (ENDOMECHANICALS) ×2 IMPLANT
STRAP SAFETY 5IN WIDE (MISCELLANEOUS) ×2 IMPLANT
SURGILUBE 2OZ TUBE FLIPTOP (MISCELLANEOUS) ×2 IMPLANT
SUT ENDO VLOC 180-0-8IN (SUTURE) ×2 IMPLANT
SUT MNCRL 4-0 (SUTURE) ×1
SUT MNCRL 4-0 27XMFL (SUTURE) ×1
SUT VIC AB 0 CT1 27 (SUTURE) ×1
SUT VIC AB 0 CT1 27XCR 8 STRN (SUTURE) ×1 IMPLANT
SUTURE MNCRL 4-0 27XMF (SUTURE) ×2 IMPLANT
SYR 10ML LL (SYRINGE) ×2 IMPLANT
SYR 50ML LL SCALE MARK (SYRINGE) ×2 IMPLANT
TROCAR ENDO BLADELESS 11MM (ENDOMECHANICALS) ×2 IMPLANT
TROCAR XCEL NON-BLD 5MMX100MML (ENDOMECHANICALS) ×2 IMPLANT
TUBING EVAC SMOKE HEATED PNEUM (TUBING) ×2 IMPLANT

## 2019-05-06 NOTE — Interval H&P Note (Signed)
History and Physical Interval Note:  05/06/2019 9:25 AM  Loma Sousa A Bolin  has presented today for surgery, with the diagnosis of CHRONIC PELVIC PAIN.  The various methods of treatment have been discussed with the patient and family. After consideration of risks, benefits and other options for treatment, the patient has consented to  Procedure(s): TOTAL LAPAROSCOPIC HYSTERECTOMY WITH BILATERAL SALPINGO OOPHORECTOMY (N/A) CYSTOSCOPY (N/A) as a surgical intervention.  The patient's history has been reviewed, patient examined, no change in status, stable for surgery.  I have reviewed the patient's chart and labs.  Questions were answered to the patient's satisfaction.     [name]

## 2019-05-06 NOTE — Progress Notes (Signed)
   Subjective: Patient reports feeling well.  Pain is well-controlled on current  analgesic regimen..  Tolerating po: Yes   Objective: Vital signs in last 24 hours: Temp:  [97.2 F (36.2 C)-98.3 F (36.8 C)] 98.3 F (36.8 C) (08/13 1645) Pulse Rate:  [61-105] 61 (08/13 1645) Resp:  [12-23] 18 (08/13 1645) BP: (114-140)/(59-99) 116/69 (08/13 1645) SpO2:  [91 %-97 %] 94 % (08/13 1645) Weight:  [112.5 kg] 112.5 kg (08/13 1700)    Intake/Output from previous day: No intake/output data recorded.  Physical Examination: OBGyn Exam  Labs: Results for orders placed or performed during the hospital encounter of 05/06/19 (from the past 72 hour(s))  Pregnancy, urine POC     Status: None   Collection Time: 05/06/19  8:01 AM  Result Value Ref Range   Preg Test, Ur NEGATIVE NEGATIVE    Comment:        THE SENSITIVITY OF THIS METHODOLOGY IS >24 mIU/mL      Assessment:  28 y.o. s/p Day of Surgery Procedure(s) (LRB): TOTAL LAPAROSCOPIC HYSTERECTOMY WITH BILATERAL SALPINGECTOMY (N/A) CYSTOSCOPY (N/A) : stable  Plan: 1) Pain: percocet and prn toradol  2) Heme: hemodynamically stable postop CBC ordered  3) FEN: may saline lock once tolerating po  4) Prophylaxis: intermittent pneumatic compression boots.  5) Disposition: The patient is to be discharged to home.  [name], MD, [facility], [facility] 05/06/2019, 5:54 PM

## 2019-05-06 NOTE — Transfer of Care (Signed)
Immediate Anesthesia Transfer of Care Note  Patient: Julie Estrada  Procedure(s) Performed: TOTAL LAPAROSCOPIC HYSTERECTOMY WITH BILATERAL SALPINGO OOPHORECTOMY (N/A ) CYSTOSCOPY (N/A )  Patient Location: PACU  Anesthesia Type:General  Level of Consciousness: drowsy  Airway & Oxygen Therapy: Patient Spontanous Breathing and Patient connected to face mask oxygen  Post-op Assessment: Report given to RN and Post -op Vital signs reviewed and stable  Post vital signs: Reviewed and stable  Last Vitals:  Vitals Value Taken Time  BP 126/80 05/06/19 1406  Temp 36.2 C 05/06/19 1406  Pulse 71 05/06/19 1408  Resp 14 05/06/19 1408  SpO2 92 % 05/06/19 1408  Vitals shown include unvalidated device data.  Last Pain:  Vitals:   05/06/19 0844  TempSrc: Tympanic  PainSc: 2          Complications: No apparent anesthesia complications

## 2019-05-06 NOTE — Op Note (Signed)
Preoperative Diagnosis: 1) 28 y.o. with abnormal uterine bleeding 2) Chronic pelvic pain  Postoperative Diagnosis: 1) 28 y.o. with abnormal uterine bleeding 2) Chronic pelvic pain 3) Adhesive disease  Operation Performed: Total laparoscopic hysterectomy, bilateral salpingectomy, and cystoscopy  Indication: Chronic pelvic pain, abnormal uterine bleeding.  She had a small uterine fibroid that had previously been noted and treated with [place]. This resolved following [place] treatment but patient continued to have symptoms.  Failed hormonal management  Surgeon: [name], MD  Assistant: Adrian Prows, MD this surgery required a high level surgical assistant with none other readily available  Anesthesia: General  Preoperative Antibiotics: none  Estimated Blood Loss: 125 mL  IV Fluids: 6104mL  Urine Output:: 495mL  Drains or Tubes: none  Implants: none  Specimens Removed: Uterus, cervix, and bilateral fallopian tubes.    Complications: none  Intraoperative Findings: Normal cervix. Normal appearing uterus.  Omental adhesion involving the right pelvic side-wall and uterus.  The ovaries appeared normal.  Status post prior partial salpingectomy bilaterally.    Patient Condition: stable  Procedure in Detail:  Patient was taken to the operating room where she was administered general anesthesia.  She was positioned in the dorsal lithotomy position utilizing Allen stirups, prepped and draped in the usual sterile fashion.  Prior to proceeding with procedure a time out was performed.  Attention was turned to the patient's pelvis.  An indwelling foley catheter was placed to decompress the patient's bladder.  An operative speculum was placed to allow visualization of the cervix.  The anterior lip of the cervix was grasped with a single tooth tenaculum, and a medium  V-care uterine manipulator was placed to allow manipulation of the uterus.  The operative speculum and single tooth  tenaculum were then removed.  Attention was turned to the patient's abdomen.  The umbilicus was infiltrated with 1% Sensorcaine, before making a stab incision using an 11 blade scalpel.  A 87mm Excel trocar was then used to gain direct entry into the peritoneal cavity utilizing the camera to visualize progress of the trocar during placement.  Once peritoneal entry had been achieved, insufflation was started and pneumoperitoneum established at a pressure of 21mmHg.    One left and one right lower quadrant site were then injected with 1% Sensorcaine and a stab incision was made using an 11 blade scalpel.  Two additional 8mm Excel trocars were placed through these incisions under direct visualization, the umbilical trocar was stepped up to a 60mm Excel trocar. General inspection of the abdomen revealed the above noted findings.   The omental adhesion was elevated and transected of the the right pelvic sidewall and round ligament using a 40mm Harmonic.  The right tube was identified and grasped at its fimbriated end.  The tube was transected from its attachments to the ovary and mesosalpinx using a 109mm Harmonic scalpel.  The utero ovarian ligament was identified ligated and transected using the Harmonic scalpel. The round ligament was then likewise ligated and transected.  The anterior leaf of the broad ligament was dissected down to the level of the internal cervical os and a bladder flap was started.  The posterior leaf of the broad ligament was dissected down to the utero-sacral ligament.  The uterine artery was skeletonized before being ligated and transected using the Harmonic scalpel with cephelad pressure applied to the V-care device to assure lateralization of the ureter.  A bite was then taken with Harmonic medial to transected portio of uterine artery to further lateralize the  ureter and vessel off the V-care cup.  The patient left adnexal structures were then dissected in similar fashion.  The bladder flap  was completed and the bladder mobilized off the V-care cup.  An anterior colpotomy was scores and carried around in a clockwise fashion to free the specimen, which was then removed vaginally.  Inspection revealed all pedicles to be hemostatic before proceed with vaginal closure.  The vaginal cuff was closed using a running V-loc load and endostitch device.  Following closure the cuff was well approximated.  The right pelvic sidewall had and area of bleeding from a mesosalpinx pedicle.  Pressure was applied and using 4x4, as well as application of fibrillar.  However, one area required application of bipolar energy to achieve hemostasis. Pelvis was irrigated, pedicles noted to be hemostatic.  3g of Arista was applied to all pedicles.   The 81mm trocar was closed using a 0 Vicryl and Carter-Thompson.  Pneumoperitoneum was evacuated and trocars were removed.  The 15m Trocar site was closed using a 4-0 Monocryl in a subcuticular fashion.  There was some bleeding noted from the left lower quadrant trocar and a figure of eight of 4-0 Monocryl was placed and achieved hemostasis.  All trocar sites were then dressed with surgical skin glue.   The indwelling foley catheter was removed.  Cystoscopy was performed noting and intact bladder dome as well as brisk efflux of urine from bother ureteral orifices.  The cystoscopy was removed.  Sponge needle and instrument counts were correct time two.  The patient tolerated the procedure well and was taken to the recovery room in stable condition.

## 2019-05-06 NOTE — Anesthesia Post-op Follow-up Note (Signed)
Anesthesia QCDR form completed.        

## 2019-05-06 NOTE — Progress Notes (Signed)
Pt c/o of pain 5/10 after 140mcg Fentanyl. Pt states pain worse with coughing. Dr. Ronelle Nigh notified. Acknowledged. Orders received. See mar

## 2019-05-06 NOTE — Anesthesia Postprocedure Evaluation (Signed)
Anesthesia Post Note  Patient: Julie Estrada  Procedure(s) Performed: TOTAL LAPAROSCOPIC HYSTERECTOMY WITH BILATERAL SALPINGECTOMY (N/A ) CYSTOSCOPY (N/A )  Patient location during evaluation: PACU Anesthesia Type: General Level of consciousness: awake and alert Pain management: pain level controlled Vital Signs Assessment: post-procedure vital signs reviewed and stable Respiratory status: spontaneous breathing, nonlabored ventilation, respiratory function stable and patient connected to nasal cannula oxygen Cardiovascular status: blood pressure returned to baseline and stable Postop Assessment: no apparent nausea or vomiting Anesthetic complications: no     Last Vitals:  Vitals:   05/06/19 1545 05/06/19 1645  BP: (!) 117/59 116/69  Pulse: 80 61  Resp: 17 18  Temp: 36.5 C 36.8 C  SpO2: 96% 94%    Last Pain:  Vitals:   05/06/19 1730  TempSrc:   PainSc: 2                  Odyn Turko S

## 2019-05-06 NOTE — Anesthesia Procedure Notes (Addendum)
Procedure Name: Intubation Performed by: Rolla Plate, CRNA Pre-anesthesia Checklist: Patient identified, Patient being monitored, Timeout performed, Emergency Drugs available and Suction available Patient Re-evaluated:Patient Re-evaluated prior to induction Oxygen Delivery Method: Circle system utilized Preoxygenation: Pre-oxygenation with 100% oxygen Induction Type: IV induction Ventilation: Mask ventilation without difficulty Laryngoscope Size: 3 and McGraph Grade View: Grade I Tube type: Oral Tube size: 7.5 mm Number of attempts: 1 Airway Equipment and Method: Stylet,  Video-laryngoscopy and LTA kit utilized Placement Confirmation: ETT inserted through vocal cords under direct vision,  positive ETCO2 and breath sounds checked- equal and bilateral Secured at: 21 cm Tube secured with: Tape Dental Injury: Teeth and Oropharynx as per pre-operative assessment

## 2019-05-06 NOTE — Anesthesia Preprocedure Evaluation (Signed)
Anesthesia Evaluation  Patient identified by MRN, date of birth, ID band Patient awake    Reviewed: Allergy & Precautions, NPO status , Patient's Chart, lab work & pertinent test results, reviewed documented beta blocker date and time   History of Anesthesia Complications (+) PONV, Family history of anesthesia reaction and history of anesthetic complications  Airway Mallampati: III  TM Distance: >3 FB     Dental  (+) Chipped   Pulmonary pneumonia, resolved, former smoker,           Cardiovascular hypertension, Pt. on medications      Neuro/Psych  Headaches, PSYCHIATRIC DISORDERS Anxiety    GI/Hepatic   Endo/Other  diabetes, Type 2Morbid obesity  Renal/GU      Musculoskeletal   Abdominal   Peds  Hematology  (+) anemia ,   Anesthesia Other Findings   Reproductive/Obstetrics                             Anesthesia Physical Anesthesia Plan  ASA: III  Anesthesia Plan: General   Post-op Pain Management:    Induction: Intravenous  PONV Risk Score and Plan:   Airway Management Planned: Oral ETT  Additional Equipment:   Intra-op Plan:   Post-operative Plan:   Informed Consent: I have reviewed the patients History and Physical, chart, labs and discussed the procedure including the risks, benefits and alternatives for the proposed anesthesia with the patient or authorized representative who has indicated his/her understanding and acceptance.       Plan Discussed with: CRNA  Anesthesia Plan Comments:         Anesthesia Quick Evaluation

## 2019-05-07 DIAGNOSIS — N939 Abnormal uterine and vaginal bleeding, unspecified: Secondary | ICD-10-CM | POA: Diagnosis not present

## 2019-05-07 LAB — CBC
HCT: 36.3 % (ref 36.0–46.0)
Hemoglobin: 11.5 g/dL — ABNORMAL LOW (ref 12.0–15.0)
MCH: 26.9 pg (ref 26.0–34.0)
MCHC: 31.7 g/dL (ref 30.0–36.0)
MCV: 85 fL (ref 80.0–100.0)
Platelets: 229 10*3/uL (ref 150–400)
RBC: 4.27 MIL/uL (ref 3.87–5.11)
RDW: 12.9 % (ref 11.5–15.5)
WBC: 13 10*3/uL — ABNORMAL HIGH (ref 4.0–10.5)
nRBC: 0 % (ref 0.0–0.2)

## 2019-05-07 LAB — BASIC METABOLIC PANEL
Anion gap: 11 (ref 5–15)
BUN: 10 mg/dL (ref 6–20)
CO2: 23 mmol/L (ref 22–32)
Calcium: 8.8 mg/dL — ABNORMAL LOW (ref 8.9–10.3)
Chloride: 103 mmol/L (ref 98–111)
Creatinine, Ser: 0.6 mg/dL (ref 0.44–1.00)
GFR calc Af Amer: >60 mL/min (ref >60)
GFR calc non Af Amer: >60 mL/min (ref >60)
Glucose, Bld: 128 mg/dL — ABNORMAL HIGH (ref 70–99)
Potassium: 4 mmol/L (ref 3.5–5.1)
Sodium: 137 mmol/L (ref 135–145)

## 2019-05-07 MED ORDER — SIMETHICONE 80 MG PO CHEW
80.0000 mg | CHEWABLE_TABLET | Freq: Four times a day (QID) | ORAL | Status: DC | PRN
  Administered 2019-05-07 (×2): 80 mg via ORAL
  Filled 2019-05-07 (×2): qty 1

## 2019-05-07 NOTE — Discharge Summary (Addendum)
Physician Discharge Summary  Patient ID: Julie Estrada MRN: [id number] DOB/AGE: 26-29-1992 26 y.o.  Admit date: 05/06/2019 Discharge date: 05/07/2019  Admission Diagnoses: Abnormal uterine bleeding, pelvic pain  Discharge Diagnoses:  Active Problems:   S/P hysterectomy   Discharged Condition: stable  Hospital Course: 28 year old [id number] with history of abnormal uterine bleeding and chronic pelvic pain failing attempts at medical management.  Ultrasound last year demonstrated a fundal fibroid which was treated with uterine artery embolization.  Fibroid responded well to treatment but patient continued to experience symptoms.  The patient underwent uncomplicated TLH, BS, and cystoscopy.  Intraoperative findings were unremarkable other than a some adhesive disease of the omentum and the right pelvic sidewall.  The patient's postoperative course was benign.  She was tolerating po, voiding, ambulating and reporting good pain control by the evening of POD0.  On POD1 the patient reported some gas pain, for which she was started on simethicone.  CBC showed appropriate H&H and WBC for postoperative state, stable BUN/Cr.    Consults: None  Significant Diagnostic Studies:  Results for orders placed or performed during the hospital encounter of 05/06/19 (from the past 24 hour(s))  Pregnancy, urine POC     Status: None   Collection Time: 05/06/19  8:01 AM  Result Value Ref Range   Preg Test, Ur NEGATIVE NEGATIVE  CBC     Status: Abnormal   Collection Time: 05/07/19  4:29 AM  Result Value Ref Range   WBC 13.0 (H) 4.0 - 10.5 K/uL   RBC 4.27 3.87 - 5.11 MIL/uL   Hemoglobin 11.5 (L) 12.0 - 15.0 g/dL   HCT 36.3 36.0 - 46.0 %   MCV 85.0 80.0 - 100.0 fL   MCH 26.9 26.0 - 34.0 pg   MCHC 31.7 30.0 - 36.0 g/dL   RDW 12.9 11.5 - 15.5 %   Platelets 229 150 - 400 K/uL   nRBC 0.0 0.0 - 0.2 %  Basic metabolic panel     Status: Abnormal   Collection Time: 05/07/19  4:29 AM  Result Value Ref Range    Sodium 137 135 - 145 mmol/L   Potassium 4.0 3.5 - 5.1 mmol/L   Chloride 103 98 - 111 mmol/L   CO2 23 22 - 32 mmol/L   Glucose, Bld 128 (H) 70 - 99 mg/dL   BUN 10 6 - 20 mg/dL   Creatinine, Ser 0.60 0.44 - 1.00 mg/dL   Calcium 8.8 (L) 8.9 - 10.3 mg/dL   GFR calc non Af Amer >60 >60 mL/min   GFR calc Af Amer >60 >60 mL/min   Anion gap 11 5 - 15     Treatments: surgery: TLH, BS cystoscopy 05/06/2019  Discharge Exam: Blood pressure 122/68, pulse 65, temperature 98.4 F (36.9 C), temperature source Oral, resp. rate 20, height 5\' 3"  (1.6 m), weight 112.5 kg, SpO2 97 %.  Temp:  [97.2 F (36.2 C)-98.4 F (36.9 C)] 98.4 F (36.9 C) (08/13 2314) Pulse Rate:  [61-105] 65 (08/13 2314) Resp:  [12-23] 20 (08/13 2314) BP: (114-140)/(53-99) 122/68 (08/13 2314) SpO2:  [91 %-97 %] 97 % (08/13 2314) Weight:  [112.5 kg] 112.5 kg (08/13 1700)  General appearance: alert, appears stated age and mild distress Resp: no increased work of breathing Extremities: extremities normal, atraumatic, no cyanosis or edema Incision/Wound:Trocar sites D/C/I Abdomen: soft, non-distended, mild tenderness around umbilical trocar sites, no rebound, no guarding  Disposition: Discharge disposition: 01-Home or Self Care       Discharge Instructions  Call MD for:   Complete by: As directed    Heavy vaginal bleeding greater than 1 pad an hour   Call MD for:   Complete by: As directed    Heavy vaginal bleeding greater than 1 pad an hour   Call MD for:  difficulty breathing, headache or visual disturbances   Complete by: As directed    Call MD for:  difficulty breathing, headache or visual disturbances   Complete by: As directed    Call MD for:  extreme fatigue   Complete by: As directed    Call MD for:  extreme fatigue   Complete by: As directed    Call MD for:  hives   Complete by: As directed    Call MD for:  hives   Complete by: As directed    Call MD for:  persistant dizziness or light-headedness    Complete by: As directed    Call MD for:  persistant dizziness or light-headedness   Complete by: As directed    Call MD for:  persistant nausea and vomiting   Complete by: As directed    Call MD for:  persistant nausea and vomiting   Complete by: As directed    Call MD for:  redness, tenderness, or signs of infection (pain, swelling, redness, odor or green/yellow discharge around incision site)   Complete by: As directed    Call MD for:  redness, tenderness, or signs of infection (pain, swelling, redness, odor or green/yellow discharge around incision site)   Complete by: As directed    Call MD for:  severe uncontrolled pain   Complete by: As directed    Call MD for:  severe uncontrolled pain   Complete by: As directed    Call MD for:  temperature >100.4   Complete by: As directed    Call MD for:  temperature >100.4   Complete by: As directed    Diet general   Complete by: As directed    Diet general   Complete by: As directed    Discharge wound care:   Complete by: As directed    You may apply a light dressing for minor discharge from the incision or to keep waistbands of clothing from rubbing.  You may also have been discharge with a clear dressing in which case this will be removed at your postoperative clinic visit.  You may shower, use soap on your incision.  Avoid baths or soaking the incision in the first 6 weeks following your surgery.Marland Kitchen   Discharge wound care:   Complete by: As directed    You may apply a light dressing for minor discharge from the incision or to keep waistbands of clothing from rubbing.  You may also have been discharge with a clear dressing in which case this will be removed at your postoperative clinic visit.  You may shower, use soap on your incision.  Avoid baths or soaking the incision in the first 6 weeks following your surgery..   Driving restriction   Complete by: As directed    Avoid driving for at least 2 weeks or while taking prescription  narcotics.   Driving restriction   Complete by: As directed    Avoid driving for at least 2 weeks or while taking prescription narcotics.   Lifting restrictions   Complete by: As directed    Weight restriction of 10lbs for 6 weeks.   Lifting restrictions   Complete by: As directed    Weight restriction of  10lbs for 6 weeks.   Sexual acrtivity   Complete by: As directed    No intercourse, tampons, or anything vaginally for 6 weeks   Sexual acrtivity   Complete by: As directed    No intercourse, tampons, or anything vaginally for 6 weeks     Allergies as of 05/07/2019      Reactions   Iodine Anaphylaxis   Shrimp [shellfish Allergy] Anaphylaxis   Doxycycline Nausea And Vomiting   Penicillins Nausea And Vomiting   Has patient had a PCN reaction causing immediate rash, facial/tongue/throat swelling, SOB or lightheadedness with hypotension: Unknown Has patient had a PCN reaction causing severe rash involving mucus membranes or skin necrosis: No Has patient had a PCN reaction that required hospitalization: No Has patient had a PCN reaction occurring within the last 10 years: Yes If all of the above answers are "NO", then may proceed with Cephalosporin use.   Morphine And Related Anxiety      Medication List    STOP taking these medications   acetaminophen 500 MG tablet Commonly known as: TYLENOL     TAKE these medications   fluticasone 50 MCG/ACT nasal spray Commonly known as: FLONASE Place 1 spray into both nostrils as needed.   HEALTHY HAIR/SKIN/NAILS PO Take 1 each by mouth daily.   ibuprofen 200 MG tablet Commonly known as: ADVIL Take 4 tablets (800 mg total) by mouth every 8 (eight) hours as needed (pain).   ondansetron 4 MG disintegrating tablet Commonly known as: ZOFRAN-ODT Take 1 tablet (4 mg total) by mouth every 6 (six) hours as needed. for nausea   oxyCODONE-acetaminophen 5-325 MG tablet Commonly known as: Percocet Take 1 tablet by mouth every 4 (four)  hours as needed for severe pain.   venlafaxine XR 150 MG 24 hr capsule Commonly known as: EFFEXOR-XR Take 150 mg by mouth at bedtime.            Discharge Care Instructions  (From admission, onward)         Start     Ordered   05/07/19 0000  Discharge wound care:    Comments: You may apply a light dressing for minor discharge from the incision or to keep waistbands of clothing from rubbing.  You may also have been discharge with a clear dressing in which case this will be removed at your postoperative clinic visit.  You may shower, use soap on your incision.  Avoid baths or soaking the incision in the first 6 weeks following your surgery.Marland Kitchen   05/07/19 0705   05/06/19 0000  Discharge wound care:    Comments: You may apply a light dressing for minor discharge from the incision or to keep waistbands of clothing from rubbing.  You may also have been discharge with a clear dressing in which case this will be removed at your postoperative clinic visit.  You may shower, use soap on your incision.  Avoid baths or soaking the incision in the first 6 weeks following your surgery.Marland Kitchen   05/06/19 1420         Follow-up Information    [name], MD In 1 week.   Specialty: Obstetrics and Gynecology Why: For wound re-check Contact information: [address] [place] [address] [phone]           Signed: [name] 05/07/2019, 7:26 AM

## 2019-05-07 NOTE — Progress Notes (Signed)
Discharge order received from doctor.  Reviewed discharge instructions and prescriptions with patient and answered all questions. Follow up appointment given. Patient verbalized understanding. Patient discharged home via wheelchair by nursing/auxillary.    Hilbert Bible, RN

## 2019-05-08 ENCOUNTER — Emergency Department
Admission: EM | Admit: 2019-05-08 | Discharge: 2019-05-08 | Disposition: A | Discharge status: Home / Self Care | Payer: BC Managed Care – PPO | Attending: Emergency Medicine | Admitting: Emergency Medicine

## 2019-05-08 ENCOUNTER — Encounter: Payer: Self-pay | Admitting: Emergency Medicine

## 2019-05-08 ENCOUNTER — Other Ambulatory Visit: Payer: Self-pay

## 2019-05-08 ENCOUNTER — Emergency Department: Payer: BC Managed Care – PPO

## 2019-05-08 DIAGNOSIS — Z880 Allergy status to penicillin: Secondary | ICD-10-CM | POA: Diagnosis not present

## 2019-05-08 DIAGNOSIS — R202 Paresthesia of skin: Secondary | ICD-10-CM

## 2019-05-08 DIAGNOSIS — Z90710 Acquired absence of both cervix and uterus: Secondary | ICD-10-CM | POA: Diagnosis not present

## 2019-05-08 DIAGNOSIS — R200 Anesthesia of skin: Secondary | ICD-10-CM | POA: Diagnosis not present

## 2019-05-08 DIAGNOSIS — Z885 Allergy status to narcotic agent status: Secondary | ICD-10-CM | POA: Insufficient documentation

## 2019-05-08 DIAGNOSIS — Z91013 Allergy to seafood: Secondary | ICD-10-CM | POA: Diagnosis not present

## 2019-05-08 DIAGNOSIS — Z87891 Personal history of nicotine dependence: Secondary | ICD-10-CM | POA: Diagnosis not present

## 2019-05-08 DIAGNOSIS — M79605 Pain in left leg: Secondary | ICD-10-CM | POA: Diagnosis present

## 2019-05-08 DIAGNOSIS — Z9889 Other specified postprocedural states: Secondary | ICD-10-CM | POA: Diagnosis not present

## 2019-05-08 DIAGNOSIS — Z91041 Radiographic dye allergy status: Secondary | ICD-10-CM | POA: Diagnosis not present

## 2019-05-08 LAB — COMPREHENSIVE METABOLIC PANEL
ALT: 20 U/L (ref 0–44)
AST: 18 U/L (ref 15–41)
Albumin: 3.3 g/dL — ABNORMAL LOW (ref 3.5–5.0)
Alkaline Phosphatase: 43 U/L (ref 38–126)
Anion gap: 8 (ref 5–15)
BUN: 14 mg/dL (ref 6–20)
CO2: 26 mmol/L (ref 22–32)
Calcium: 8.3 mg/dL — ABNORMAL LOW (ref 8.9–10.3)
Chloride: 104 mmol/L (ref 98–111)
Creatinine, Ser: 0.72 mg/dL (ref 0.44–1.00)
GFR calc Af Amer: >60 mL/min (ref >60)
GFR calc non Af Amer: >60 mL/min (ref >60)
Glucose, Bld: 125 mg/dL — ABNORMAL HIGH (ref 70–99)
Potassium: 3.4 mmol/L — ABNORMAL LOW (ref 3.5–5.1)
Sodium: 138 mmol/L (ref 135–145)
Total Bilirubin: <0.1 mg/dL — ABNORMAL LOW (ref 0.3–1.2)
Total Protein: 6.8 g/dL (ref 6.5–8.1)

## 2019-05-08 LAB — CBC WITH DIFFERENTIAL/PLATELET
Abs Immature Granulocytes: 0.03 10*3/uL (ref 0.00–0.07)
Basophils Absolute: 0 10*3/uL (ref 0.0–0.1)
Basophils Relative: 0 %
Eosinophils Absolute: 0.1 10*3/uL (ref 0.0–0.5)
Eosinophils Relative: 2 %
HCT: 34.9 % — ABNORMAL LOW (ref 36.0–46.0)
Hemoglobin: 11 g/dL — ABNORMAL LOW (ref 12.0–15.0)
Immature Granulocytes: 0 %
Lymphocytes Relative: 37 %
Lymphs Abs: 2.8 10*3/uL (ref 0.7–4.0)
MCH: 26.6 pg (ref 26.0–34.0)
MCHC: 31.5 g/dL (ref 30.0–36.0)
MCV: 84.5 fL (ref 80.0–100.0)
Monocytes Absolute: 0.5 10*3/uL (ref 0.1–1.0)
Monocytes Relative: 7 %
Neutro Abs: 4 10*3/uL (ref 1.7–7.7)
Neutrophils Relative %: 54 %
Platelets: 188 10*3/uL (ref 150–400)
RBC: 4.13 MIL/uL (ref 3.87–5.11)
RDW: 13.1 % (ref 11.5–15.5)
WBC: 7.6 10*3/uL (ref 4.0–10.5)
nRBC: 0 % (ref 0.0–0.2)

## 2019-05-08 NOTE — ED Triage Notes (Signed)
Pt presents to ED via POV, states had hysterectomy on Thursday, D/C yesterday, was told if she had any leg pain/tingling to come back and be ruled out for blood clot. Pt c/o L leg pain/tingling and L foot numbness. Pt also c/o numbness to R toes and R fingers

## 2019-05-08 NOTE — ED Notes (Signed)
E-signature not working at this time. Pt verbalizes understanding of d/c instructions. No further questions at this time. Pt ambulatory to lobby in nad.

## 2019-05-08 NOTE — ED Notes (Signed)
Dr Williams at bedside 

## 2019-05-08 NOTE — ED Notes (Signed)
Pt sitting in bed speaking with this RN in NAD. PT has redness below bellybutton that she states is new.
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
## 2019-05-11 LAB — SURGICAL PATHOLOGY

## 2019-05-13 ENCOUNTER — Other Ambulatory Visit: Payer: Self-pay

## 2019-05-13 ENCOUNTER — Ambulatory Visit (INDEPENDENT_AMBULATORY_CARE_PROVIDER_SITE_OTHER): Payer: BC Managed Care – PPO | Admitting: Obstetrics and Gynecology

## 2019-05-13 VITALS — BP 136/74 | HR 93 | Ht 63.0 in | Wt 247.0 lb

## 2019-05-13 DIAGNOSIS — Z4889 Encounter for other specified surgical aftercare: Secondary | ICD-10-CM
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
## 2019-05-18 ENCOUNTER — Telehealth: Payer: Self-pay

## 2019-05-18 NOTE — Telephone Encounter (Signed)
Pt called stating that the scab on her belly button incision had fallen off yesterday and she noticed that a suture is sticking out, it is not that bothersome but it is bothering her a little bit, pt wanted to know it will go away on its own or if she needed to come in to be seen. Please advise.

## 2019-05-24 ENCOUNTER — Ambulatory Visit: Payer: BC Managed Care – PPO | Admitting: Obstetrics and Gynecology

## 2019-05-24 NOTE — Telephone Encounter (Signed)
Appt as it has been > 2 weeks

## 2019-05-25 ENCOUNTER — Ambulatory Visit (INDEPENDENT_AMBULATORY_CARE_PROVIDER_SITE_OTHER): Payer: BC Managed Care – PPO | Admitting: Obstetrics and Gynecology

## 2019-05-25 ENCOUNTER — Other Ambulatory Visit: Payer: Self-pay

## 2019-05-25 ENCOUNTER — Encounter: Payer: Self-pay | Admitting: Obstetrics and Gynecology

## 2019-05-25 VITALS — BP 120/70 | HR 91 | Temp 98.6°F | Wt 248.0 lb

## 2019-05-25 DIAGNOSIS — Z4889 Encounter for other specified surgical aftercare: Secondary | ICD-10-CM

## 2019-05-25 MED ORDER — GABAPENTIN 100 MG PO CAPS: 100.0000 mg | ORAL_CAPSULE | Freq: Three times a day (TID) | ORAL | 6 refills | Status: DC

## 2019-05-25 NOTE — Progress Notes (Signed)
Postoperative Follow-up Patient presents post op from [place], BS, cystoscopy 3weeks ago for abnormal uterine bleeding and pelvic pain.  Subjective: Patient reports some improvement in her preop symptoms. Eating a regular diet without difficulty. Continues to have issues with periumbilical pain at [id number] port site.   Activity: normal activities of daily living.  Objective: Blood pressure 120/70, pulse 91, temperature 98.6 F (37 C), weight 248 lb (112.5 kg), last menstrual period 04/06/2019.  General: NAD Pulmonary: no increased work of breathing Abdomen: soft, non-tender, non-distended, incision(s) D/C/I.  Small amount of retained Monocryl stitch material removed from umbilical incision.  Healing well otherwise, no evidence of her  Extremities: no edema Neurologic: normal gait    Admission on 05/08/2019, Discharged on 05/08/2019  Component Date Value Ref Range Status  . WBC 05/08/2019 7.6  4.0 - 10.5 K/uL Final  . RBC 05/08/2019 4.13  3.87 - 5.11 MIL/uL Final  . Hemoglobin 05/08/2019 11.0* 12.0 - 15.0 g/dL Final  . HCT 05/08/2019 34.9* 36.0 - 46.0 % Final  . MCV 05/08/2019 84.5  80.0 - 100.0 fL Final  . MCH 05/08/2019 26.6  26.0 - 34.0 pg Final  . MCHC 05/08/2019 31.5  30.0 - 36.0 g/dL Final  . RDW 05/08/2019 13.1  11.5 - 15.5 % Final  . Platelets 05/08/2019 188  150 - 400 K/uL Final  . nRBC 05/08/2019 0.0  0.0 - 0.2 % Final  . Neutrophils Relative % 05/08/2019 54  % Final  . Neutro Abs 05/08/2019 4.0  1.7 - 7.7 K/uL Final  . Lymphocytes Relative 05/08/2019 37  % Final  . Lymphs Abs 05/08/2019 2.8  0.7 - 4.0 K/uL Final  . Monocytes Relative 05/08/2019 7  % Final  . Monocytes Absolute 05/08/2019 0.5  0.1 - 1.0 K/uL Final  . Eosinophils Relative 05/08/2019 2  % Final  . Eosinophils Absolute 05/08/2019 0.1  0.0 - 0.5 K/uL Final  . Basophils Relative 05/08/2019 0  % Final  . Basophils Absolute 05/08/2019 0.0  0.0 - 0.1 K/uL Final  . Immature Granulocytes 05/08/2019 0  % Final   . Abs Immature Granulocytes 05/08/2019 0.03  0.00 - 0.07 K/uL Final   Performed at [facility], [address], [place], [place] [address]  . Sodium 05/08/2019 138  135 - 145 mmol/L Final  . Potassium 05/08/2019 3.4* 3.5 - 5.1 mmol/L Final  . Chloride 05/08/2019 104  98 - 111 mmol/L Final  . CO2 05/08/2019 26  22 - 32 mmol/L Final  . Glucose, Bld 05/08/2019 125* 70 - 99 mg/dL Final  . BUN 05/08/2019 14  6 - 20 mg/dL Final  . Creatinine, Ser 05/08/2019 0.72  0.44 - 1.00 mg/dL Final  . Calcium 05/08/2019 8.3* 8.9 - 10.3 mg/dL Final  . Total Protein 05/08/2019 6.8  6.5 - 8.1 g/dL Final  . Albumin 05/08/2019 3.3* 3.5 - 5.0 g/dL Final  . AST 05/08/2019 18  15 - 41 U/L Final  . ALT 05/08/2019 20  0 - 44 U/L Final  . Alkaline Phosphatase 05/08/2019 43  38 - 126 U/L Final  . Total Bilirubin 05/08/2019 <0.1* 0.3 - 1.2 mg/dL Final  . GFR calc non Af Amer 05/08/2019 >60  >60 mL/min Final  . GFR calc Af Amer 05/08/2019 >60  >60 mL/min Final  . Anion gap 05/08/2019 8  5 - 15 Final   Performed at [facility], [address], [place], [place] [address]    Assessment: 28 y.o. s/p TLH,  BS, cystoscopy stable  Plan: Patient has done well after surgery with no apparent complications.  I have discussed the post-operative course to date, and the expected progress moving forward.  The patient understands what complications to be concerned about.  I will see the patient in routine follow up, or sooner if needed.    Activity plan: No heavy lifting, no intercourse  At this point in her postoperative time frame no further narcotics.  We also discussed that nerve pain from fascial stitch is not well covered by narcotics.  Will trial patient on a course of gabapentin   [name], MD, [facility], [place]

## 2019-06-17 ENCOUNTER — Encounter: Payer: Self-pay | Admitting: Obstetrics and Gynecology

## 2019-06-17 ENCOUNTER — Other Ambulatory Visit: Payer: Self-pay

## 2019-06-17 ENCOUNTER — Ambulatory Visit (INDEPENDENT_AMBULATORY_CARE_PROVIDER_SITE_OTHER): Payer: BC Managed Care – PPO | Admitting: Obstetrics and Gynecology

## 2019-06-17 VITALS — BP 140/88 | HR 88 | Wt 251.0 lb

## 2019-06-17 DIAGNOSIS — Z4889 Encounter for other specified surgical aftercare: Secondary | ICD-10-CM

## 2019-06-17 MED ORDER — PHENTERMINE HCL 37.5 MG PO TABS: 37.5000 mg | ORAL_TABLET | Freq: Every day | ORAL | 0 refills | Status: DC

## 2019-06-17 NOTE — Progress Notes (Signed)
Postoperative Follow-up Patient presents post op from [place], BS, cystoscopy 6weeks ago for AUB, dysmenorrhea.  Subjective: Patient reports marked improvement in her preop symptoms. Eating a regular diet without difficulty. Pain is controlled without any medications.  Activity: normal activities of daily living.  Objective: Blood pressure 140/88, pulse 88, weight 251 lb (113.9 kg), last menstrual period 04/06/2019.  General: NAD Pulmonary: no increased work of breathing Abdomen: soft, non-tender, non-distended, incision(s) D/C/I GU: normal external female genitalia vaginal cuff intact, well healed Extremities: no edema Neurologic: normal gait   Admission on 05/08/2019, Discharged on 05/08/2019  Component Date Value Ref Range Status  . WBC 05/08/2019 7.6  4.0 - 10.5 K/uL Final  . RBC 05/08/2019 4.13  3.87 - 5.11 MIL/uL Final  . Hemoglobin 05/08/2019 11.0* 12.0 - 15.0 g/dL Final  . HCT 05/08/2019 34.9* 36.0 - 46.0 % Final  . MCV 05/08/2019 84.5  80.0 - 100.0 fL Final  . MCH 05/08/2019 26.6  26.0 - 34.0 pg Final  . MCHC 05/08/2019 31.5  30.0 - 36.0 g/dL Final  . RDW 05/08/2019 13.1  11.5 - 15.5 % Final  . Platelets 05/08/2019 188  150 - 400 K/uL Final  . nRBC 05/08/2019 0.0  0.0 - 0.2 % Final  . Neutrophils Relative % 05/08/2019 54  % Final  . Neutro Abs 05/08/2019 4.0  1.7 - 7.7 K/uL Final  . Lymphocytes Relative 05/08/2019 37  % Final  . Lymphs Abs 05/08/2019 2.8  0.7 - 4.0 K/uL Final  . Monocytes Relative 05/08/2019 7  % Final  . Monocytes Absolute 05/08/2019 0.5  0.1 - 1.0 K/uL Final  . Eosinophils Relative 05/08/2019 2  % Final  . Eosinophils Absolute 05/08/2019 0.1  0.0 - 0.5 K/uL Final  . Basophils Relative 05/08/2019 0  % Final  . Basophils Absolute 05/08/2019 0.0  0.0 - 0.1 K/uL Final  . Immature Granulocytes 05/08/2019 0  % Final  . Abs Immature Granulocytes 05/08/2019 0.03  0.00 - 0.07 K/uL Final   Performed at [facility], [address]  [place], [place] [address]  . Sodium 05/08/2019 138  135 - 145 mmol/L Final  . Potassium 05/08/2019 3.4* 3.5 - 5.1 mmol/L Final  . Chloride 05/08/2019 104  98 - 111 mmol/L Final  . CO2 05/08/2019 26  22 - 32 mmol/L Final  . Glucose, Bld 05/08/2019 125* 70 - 99 mg/dL Final  . BUN 05/08/2019 14  6 - 20 mg/dL Final  . Creatinine, Ser 05/08/2019 0.72  0.44 - 1.00 mg/dL Final  . Calcium 05/08/2019 8.3* 8.9 - 10.3 mg/dL Final  . Total Protein 05/08/2019 6.8  6.5 - 8.1 g/dL Final  . Albumin 05/08/2019 3.3* 3.5 - 5.0 g/dL Final  . AST 05/08/2019 18  15 - 41 U/L Final  . ALT 05/08/2019 20  0 - 44 U/L Final  . Alkaline Phosphatase 05/08/2019 43  38 - 126 U/L Final  . Total Bilirubin 05/08/2019 <0.1* 0.3 - 1.2 mg/dL Final  . GFR calc non Af Amer 05/08/2019 >60  >60 mL/min Final  . GFR calc Af Amer 05/08/2019 >60  >60 mL/min Final  . Anion gap 05/08/2019 8  5 - 15 Final   Performed at [facility], [address] [place], [place] [address]    Assessment: 28 y.o. s/p TLH, BS, cystoscopy stable  Plan: Patient has done well after surgery with no apparent complications.  I have discussed the post-operative course to date, and the expected  progress moving forward.  The patient understands what complications to be concerned about.  I will see the patient in routine follow up, or sooner if needed.    Activity plan: No restriction.   [name], MD, [facility], [facility] 06/17/2019, 10:41 AM

## 2019-07-14 ENCOUNTER — Other Ambulatory Visit: Payer: Self-pay | Admitting: Obstetrics and Gynecology

## 2019-07-23 ENCOUNTER — Ambulatory Visit (INDEPENDENT_AMBULATORY_CARE_PROVIDER_SITE_OTHER): Payer: BC Managed Care – PPO | Admitting: Obstetrics and Gynecology

## 2019-07-23 ENCOUNTER — Telehealth: Payer: Self-pay

## 2019-07-23 ENCOUNTER — Other Ambulatory Visit: Payer: Self-pay

## 2019-07-23 DIAGNOSIS — Z6841 Body Mass Index (BMI) 40.0 and over, adult: Secondary | ICD-10-CM | POA: Diagnosis not present

## 2019-07-23 DIAGNOSIS — N939 Abnormal uterine and vaginal bleeding, unspecified: Secondary | ICD-10-CM | POA: Diagnosis not present

## 2019-07-23 NOTE — Telephone Encounter (Signed)
advise

## 2019-07-23 NOTE — Telephone Encounter (Signed)
Julie Estrada, per AMS, pleas ecall pt and get her to come in at the 3:30 slot. Let her know it may change if he gets called out

## 2019-07-23 NOTE — Telephone Encounter (Signed)
Patient is schedule for today 07/23/19 at 3:30

## 2019-07-23 NOTE — Telephone Encounter (Signed)
Pt calling today c/o bleeding bright red blood today with cramping. Hysterectomy about 10 weeks ago. Please advise

## 2019-07-23 NOTE — Telephone Encounter (Signed)
I guess that is something I have to see in person
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
## 2019-07-26 MED ORDER — PHENTERMINE HCL 37.5 MG PO TABS: 37.5000 mg | ORAL_TABLET | Freq: Every day | ORAL | 0 refills | Status: DC

## 2019-07-26 MED ORDER — PHENTERMINE HCL 37.5 MG PO TABS: ORAL_TABLET | ORAL | 0 refills | Status: DC

## 2019-07-26 NOTE — Telephone Encounter (Signed)
Called and left voice mail for patient to call back to be schedule °

## 2019-07-26 NOTE — Telephone Encounter (Signed)
4 weeks phone follow up

## 2019-08-16 ENCOUNTER — Other Ambulatory Visit: Payer: Self-pay

## 2019-08-16 DIAGNOSIS — R200 Anesthesia of skin: Secondary | ICD-10-CM | POA: Insufficient documentation

## 2019-08-16 MED ORDER — PHENTERMINE HCL 37.5 MG PO TABS: 37.5000 mg | ORAL_TABLET | Freq: Every day | ORAL | 0 refills | Status: DC

## 2019-09-07 ENCOUNTER — Other Ambulatory Visit: Payer: Self-pay | Admitting: Obstetrics and Gynecology

## 2019-09-07 ENCOUNTER — Ambulatory Visit: Payer: BC Managed Care – PPO | Admitting: Obstetrics and Gynecology

## 2019-09-07 ENCOUNTER — Telehealth: Payer: Self-pay | Admitting: Obstetrics and Gynecology

## 2019-09-07 MED ORDER — SAXENDA 18 MG/3ML ~~LOC~~ SOPN: PEN_INJECTOR | SUBCUTANEOUS | 2 refills | Status: DC

## 2019-09-07 MED ORDER — NOVOFINE 32G X 6 MM MISC: 1.0000 [IU] | Freq: Every day | 3 refills | Status: DC

## 2019-09-07 NOTE — Telephone Encounter (Signed)
She isn't loosing weight on the phentermine so its the saxenda or nothing at this point

## 2019-09-07 NOTE — Telephone Encounter (Signed)
PT rescheduled her weight check appointment d/t it being at 1:50. She thought it was during her lunchtime. Please refill weight loss pill, she can not do injection

## 2019-09-07 NOTE — Telephone Encounter (Signed)
Saxenda rx has been sent

## 2019-09-07 NOTE — Telephone Encounter (Signed)
Patient is calling with weight 245.

## 2019-09-13 ENCOUNTER — Other Ambulatory Visit: Payer: Self-pay

## 2019-09-13 MED ORDER — PHENTERMINE HCL 37.5 MG PO TABS: 37.5000 mg | ORAL_TABLET | Freq: Every day | ORAL | 0 refills | Status: DC

## 2019-09-13 NOTE — Telephone Encounter (Signed)
PA for Saxenda denied stating " [employer] will not be covered when the member has taken another weight loss medication in the past 12 months but did not lose weight".   AMS is out of the office this week. I will make pt aware of medication denial

## 2019-09-23 ENCOUNTER — Other Ambulatory Visit: Payer: Self-pay | Admitting: Obstetrics and Gynecology

## 2019-09-23 NOTE — Telephone Encounter (Signed)
Advise

## 2019-10-15 ENCOUNTER — Encounter: Payer: Self-pay | Admitting: Obstetrics and Gynecology

## 2019-10-15 ENCOUNTER — Ambulatory Visit (INDEPENDENT_AMBULATORY_CARE_PROVIDER_SITE_OTHER): Payer: BC Managed Care – PPO | Admitting: Obstetrics and Gynecology

## 2019-10-15 ENCOUNTER — Other Ambulatory Visit: Payer: Self-pay

## 2019-10-15 VITALS — BP 134/88 | Ht 63.0 in | Wt 255.0 lb

## 2019-10-15 DIAGNOSIS — N644 Mastodynia: Secondary | ICD-10-CM | POA: Diagnosis not present

## 2019-10-15 DIAGNOSIS — N643 Galactorrhea not associated with childbirth: Secondary | ICD-10-CM

## 2019-10-15 DIAGNOSIS — Z6841 Body Mass Index (BMI) 40.0 and over, adult: Secondary | ICD-10-CM | POA: Diagnosis not present

## 2019-10-15 MED ORDER — DIETHYLPROPION HCL ER 75 MG PO TB24: 1.0000 | ORAL_TABLET | Freq: Every day | ORAL | 0 refills | Status: DC
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
## 2019-10-16 LAB — TSH: TSH: 1.44 u[IU]/mL (ref 0.450–4.500)

## 2019-10-16 LAB — PROLACTIN: Prolactin: 22.3 ng/mL (ref 4.8–23.3)

## 2019-10-18 ENCOUNTER — Telehealth: Payer: Self-pay | Admitting: Obstetrics and Gynecology

## 2019-10-18 NOTE — Telephone Encounter (Signed)
Left message for patient to call back in regards to her mammo appt.

## 2019-10-18 NOTE — Telephone Encounter (Signed)
Pt aware but going to call to see fi they have afternoon appointment

## 2019-10-20 ENCOUNTER — Other Ambulatory Visit: Payer: BC Managed Care – PPO

## 2019-10-20 ENCOUNTER — Ambulatory Visit
Admission: RE | Admit: 2019-10-20 | Discharge: 2019-10-20 | Disposition: A | Discharge status: Home / Self Care | Payer: BC Managed Care – PPO | Source: Ambulatory Visit | Attending: Obstetrics and Gynecology | Admitting: Obstetrics and Gynecology

## 2019-10-20 DIAGNOSIS — N643 Galactorrhea not associated with childbirth: Secondary | ICD-10-CM | POA: Diagnosis not present

## 2019-10-20 DIAGNOSIS — N644 Mastodynia: Secondary | ICD-10-CM | POA: Diagnosis present

## 2019-11-02 ENCOUNTER — Ambulatory Visit: Payer: BC Managed Care – PPO | Attending: Internal Medicine

## 2019-11-02 DIAGNOSIS — Z20822 Contact with and (suspected) exposure to covid-19: Secondary | ICD-10-CM

## 2019-11-03 LAB — NOVEL CORONAVIRUS, NAA: SARS-CoV-2, NAA: NOT DETECTED

## 2019-12-20 ENCOUNTER — Other Ambulatory Visit: Payer: Self-pay | Admitting: Obstetrics and Gynecology

## 2019-12-20 MED ORDER — PHENTERMINE HCL 37.5 MG PO TABS: 37.5000 mg | ORAL_TABLET | Freq: Every day | ORAL | 0 refills | Status: DC

## 2019-12-20 NOTE — Progress Notes (Signed)
Current weight 153.4lbs wants to restart phentermine will follow up for weight check in 4 weeks to assess response

## 2019-12-23 DIAGNOSIS — R7303 Prediabetes: Secondary | ICD-10-CM | POA: Insufficient documentation

## 2019-12-27 DIAGNOSIS — R5383 Other fatigue: Secondary | ICD-10-CM | POA: Insufficient documentation

## 2019-12-27 DIAGNOSIS — G43519 Persistent migraine aura without cerebral infarction, intractable, without status migrainosus: Secondary | ICD-10-CM | POA: Insufficient documentation

## 2019-12-28 DIAGNOSIS — E781 Pure hyperglyceridemia: Secondary | ICD-10-CM | POA: Insufficient documentation

## 2019-12-28 DIAGNOSIS — E538 Deficiency of other specified B group vitamins: Secondary | ICD-10-CM | POA: Insufficient documentation

## 2019-12-28 DIAGNOSIS — E559 Vitamin D deficiency, unspecified: Secondary | ICD-10-CM | POA: Insufficient documentation

## 2020-01-14 ENCOUNTER — Telehealth (INDEPENDENT_AMBULATORY_CARE_PROVIDER_SITE_OTHER): Payer: BC Managed Care – PPO | Admitting: Obstetrics and Gynecology

## 2020-01-14 ENCOUNTER — Other Ambulatory Visit: Payer: Self-pay

## 2020-01-14 VITALS — Wt 247.0 lb

## 2020-01-14 DIAGNOSIS — Z6841 Body Mass Index (BMI) 40.0 and over, adult: Secondary | ICD-10-CM

## 2020-01-14 MED ORDER — PHENTERMINE HCL 37.5 MG PO TABS: 37.5000 mg | ORAL_TABLET | Freq: Every day | ORAL | 0 refills | Status: DC
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
## 2020-02-11 ENCOUNTER — Telehealth (INDEPENDENT_AMBULATORY_CARE_PROVIDER_SITE_OTHER): Payer: BC Managed Care – PPO | Admitting: Obstetrics and Gynecology

## 2020-02-11 ENCOUNTER — Other Ambulatory Visit: Payer: Self-pay

## 2020-02-11 VITALS — Wt 247.0 lb

## 2020-02-11 DIAGNOSIS — Z6841 Body Mass Index (BMI) 40.0 and over, adult: Secondary | ICD-10-CM

## 2020-02-11 MED ORDER — PHENTERMINE HCL 37.5 MG PO TABS: 37.5000 mg | ORAL_TABLET | Freq: Every day | ORAL | 0 refills | Status: DC
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
## 2020-06-03 ENCOUNTER — Other Ambulatory Visit: Payer: Self-pay

## 2020-06-03 ENCOUNTER — Encounter: Payer: Self-pay | Admitting: Emergency Medicine

## 2020-06-03 DIAGNOSIS — R1031 Right lower quadrant pain: Secondary | ICD-10-CM | POA: Diagnosis not present

## 2020-06-03 DIAGNOSIS — Z79899 Other long term (current) drug therapy: Secondary | ICD-10-CM | POA: Diagnosis not present

## 2020-06-03 DIAGNOSIS — Z87891 Personal history of nicotine dependence: Secondary | ICD-10-CM | POA: Diagnosis not present

## 2020-06-03 LAB — URINALYSIS, COMPLETE (UACMP) WITH MICROSCOPIC
Bacteria, UA: NONE SEEN
Bilirubin Urine: NEGATIVE
Glucose, UA: NEGATIVE mg/dL
Hgb urine dipstick: NEGATIVE
Ketones, ur: 5 mg/dL — AB
Leukocytes,Ua: NEGATIVE
Nitrite: NEGATIVE
Protein, ur: NEGATIVE mg/dL
Specific Gravity, Urine: 1.032 — ABNORMAL HIGH (ref 1.005–1.030)
pH: 5 (ref 5.0–8.0)

## 2020-06-03 LAB — BASIC METABOLIC PANEL
Anion gap: 9 (ref 5–15)
BUN: 16 mg/dL (ref 6–20)
CO2: 23 mmol/L (ref 22–32)
Calcium: 9.1 mg/dL (ref 8.9–10.3)
Chloride: 107 mmol/L (ref 98–111)
Creatinine, Ser: 0.81 mg/dL (ref 0.44–1.00)
GFR calc Af Amer: >60 mL/min (ref >60)
GFR calc non Af Amer: >60 mL/min (ref >60)
Glucose, Bld: 141 mg/dL — ABNORMAL HIGH (ref 70–99)
Potassium: 3.8 mmol/L (ref 3.5–5.1)
Sodium: 139 mmol/L (ref 135–145)

## 2020-06-03 LAB — CBC
HCT: 38 % (ref 36.0–46.0)
Hemoglobin: 12.4 g/dL (ref 12.0–15.0)
MCH: 27.7 pg (ref 26.0–34.0)
MCHC: 32.6 g/dL (ref 30.0–36.0)
MCV: 84.8 fL (ref 80.0–100.0)
Platelets: 263 10*3/uL (ref 150–400)
RBC: 4.48 MIL/uL (ref 3.87–5.11)
RDW: 12.6 % (ref 11.5–15.5)
WBC: 5.6 10*3/uL (ref 4.0–10.5)
nRBC: 0 % (ref 0.0–0.2)

## 2020-06-03 MED ORDER — OXYCODONE-ACETAMINOPHEN 5-325 MG PO TABS
1.0000 | ORAL_TABLET | ORAL | Status: DC | PRN
  Administered 2020-06-03: 1 via ORAL
  Filled 2020-06-03: qty 1

## 2020-06-03 NOTE — ED Triage Notes (Addendum)
Pt here for right flank pain that radiates around to abdomen.  Denies fever. No vomiting. Denies hx of kidney stones.  VSS.  Also has had migraine for 2 days.  No known hematuria. Pt appears in pain

## 2020-06-03 NOTE — ED Notes (Signed)
Pt reports has no problems with percocet despite morphine allergy.  Pt did not drive here, aware cannot drive after pain medication per protocol

## 2020-06-04 ENCOUNTER — Emergency Department
Admission: EM | Admit: 2020-06-04 | Discharge: 2020-06-04 | Disposition: A | Discharge status: Home / Self Care | Payer: BC Managed Care – PPO | Attending: Emergency Medicine | Admitting: Emergency Medicine

## 2020-06-04 ENCOUNTER — Emergency Department: Payer: BC Managed Care – PPO

## 2020-06-04 DIAGNOSIS — R109 Unspecified abdominal pain: Secondary | ICD-10-CM

## 2020-06-04 MED ORDER — ONDANSETRON 4 MG PO TBDP: ORAL_TABLET | ORAL | 0 refills | Status: DC

## 2020-06-04 MED ORDER — OXYCODONE-ACETAMINOPHEN 5-325 MG PO TABS
2.0000 | ORAL_TABLET | Freq: Once | ORAL | Status: AC
  Administered 2020-06-04: 2 via ORAL
  Filled 2020-06-04: qty 2

## 2020-06-04 MED ORDER — HYDROCODONE-ACETAMINOPHEN 5-325 MG PO TABS: 2.0000 | ORAL_TABLET | Freq: Four times a day (QID) | ORAL | 0 refills | Status: DC | PRN
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
## 2020-06-04 NOTE — ED Notes (Signed)
Pt states her husband is coming to pick her up. Pt informed she cannot driver after percocet administration.

## 2020-06-04 NOTE — Discharge Instructions (Addendum)
As we discussed, your work-up was essentially normal and reassuring with no obvious explanation for your pain. It could be musculoskeletal but it is also possible you had a kidney stone which you subsequently passed but still have some residual discomfort. Please drink plenty of fluids and use over-the-counter ibuprofen as needed for pain control according to label instructions. Take Norco as prescribed for severe pain. Do not drink alcohol, drive or participate in any other potentially dangerous activities while taking this medication as it may make you sleepy. Do not take this medication with any other sedating medications, either prescription or over-the-counter. If you were prescribed Percocet or Vicodin, do not take these with acetaminophen (Tylenol) as it is already contained within these medications.   This medication is an opiate (or narcotic) pain medication and can be habit forming.  Use it as little as possible to achieve adequate pain control.  Do not use or use it with extreme caution if you have a history of opiate abuse or dependence.  If you are on a pain contract with your primary care doctor or a pain specialist, be sure to let them know you were prescribed this medication today from the [facility] Emergency Department.  This medication is intended for your use only - do not give any to anyone else and keep it in a secure place where nobody else, especially children, have access to it.  It will also cause or worsen constipation, so you may want to consider taking an over-the-counter stool softener while you are taking this medication.    Return to the emergency department if you develop new or worsening symptoms that concern you.

## 2020-06-11 ENCOUNTER — Other Ambulatory Visit: Payer: Self-pay | Admitting: Obstetrics and Gynecology

## 2020-06-12 NOTE — Telephone Encounter (Signed)
Last appointment 01/2020, no current future appointments made

## 2020-07-18 ENCOUNTER — Other Ambulatory Visit: Payer: Self-pay | Admitting: Gerontology

## 2020-07-18 DIAGNOSIS — R200 Anesthesia of skin: Secondary | ICD-10-CM

## 2020-07-18 DIAGNOSIS — G43519 Persistent migraine aura without cerebral infarction, intractable, without status migrainosus: Secondary | ICD-10-CM

## 2020-07-18 DIAGNOSIS — R42 Dizziness and giddiness: Secondary | ICD-10-CM

## 2020-07-20 DIAGNOSIS — R160 Hepatomegaly, not elsewhere classified: Secondary | ICD-10-CM | POA: Insufficient documentation

## 2020-07-20 DIAGNOSIS — E161 Other hypoglycemia: Secondary | ICD-10-CM | POA: Insufficient documentation

## 2020-08-03 ENCOUNTER — Other Ambulatory Visit: Payer: Self-pay

## 2020-08-03 ENCOUNTER — Ambulatory Visit
Admission: RE | Admit: 2020-08-03 | Discharge: 2020-08-03 | Disposition: A | Discharge status: Home / Self Care | Payer: BC Managed Care – PPO | Source: Ambulatory Visit | Attending: Gerontology | Admitting: Gerontology

## 2020-08-03 DIAGNOSIS — R200 Anesthesia of skin: Secondary | ICD-10-CM | POA: Diagnosis present

## 2020-08-03 DIAGNOSIS — G43519 Persistent migraine aura without cerebral infarction, intractable, without status migrainosus: Secondary | ICD-10-CM | POA: Insufficient documentation

## 2020-08-03 DIAGNOSIS — R42 Dizziness and giddiness: Secondary | ICD-10-CM | POA: Diagnosis not present

## 2020-08-03 MED ORDER — GADOBUTROL 1 MMOL/ML IV SOLN
10.0000 mL | Freq: Once | INTRAVENOUS | Status: AC | PRN
  Administered 2020-08-03: 10 mL via INTRAVENOUS

## 2020-11-16 ENCOUNTER — Other Ambulatory Visit: Payer: Self-pay

## 2020-11-16 ENCOUNTER — Encounter: Payer: Self-pay | Admitting: Otolaryngology

## 2020-11-17 ENCOUNTER — Other Ambulatory Visit
Admission: RE | Admit: 2020-11-17 | Discharge: 2020-11-17 | Disposition: A | Discharge status: Home / Self Care | Payer: BC Managed Care – PPO | Source: Ambulatory Visit | Attending: Otolaryngology | Admitting: Otolaryngology

## 2020-11-17 DIAGNOSIS — Z20822 Contact with and (suspected) exposure to covid-19: Secondary | ICD-10-CM | POA: Insufficient documentation

## 2020-11-17 DIAGNOSIS — Z01812 Encounter for preprocedural laboratory examination: Secondary | ICD-10-CM | POA: Insufficient documentation

## 2020-11-17 LAB — SARS CORONAVIRUS 2 (TAT 6-24 HRS): SARS Coronavirus 2: NEGATIVE

## 2020-11-21 ENCOUNTER — Ambulatory Visit
Admission: RE | Admit: 2020-11-21 | Discharge: 2020-11-21 | Disposition: A | Discharge status: Home / Self Care | Payer: BC Managed Care – PPO | Attending: Otolaryngology | Admitting: Otolaryngology

## 2020-11-21 ENCOUNTER — Encounter: Payer: Self-pay | Admitting: Otolaryngology

## 2020-11-21 ENCOUNTER — Ambulatory Visit: Payer: BC Managed Care – PPO | Admitting: Anesthesiology

## 2020-11-21 ENCOUNTER — Encounter
Admission: RE | Disposition: A | Discharge status: Home / Self Care | Payer: Self-pay | Source: Home / Self Care | Attending: Otolaryngology

## 2020-11-21 ENCOUNTER — Other Ambulatory Visit: Payer: Self-pay

## 2020-11-21 DIAGNOSIS — J358 Other chronic diseases of tonsils and adenoids: Secondary | ICD-10-CM | POA: Diagnosis not present

## 2020-11-21 DIAGNOSIS — Z7984 Long term (current) use of oral hypoglycemic drugs: Secondary | ICD-10-CM | POA: Diagnosis not present

## 2020-11-21 DIAGNOSIS — Z87891 Personal history of nicotine dependence: Secondary | ICD-10-CM | POA: Insufficient documentation

## 2020-11-21 DIAGNOSIS — J353 Hypertrophy of tonsils with hypertrophy of adenoids: Secondary | ICD-10-CM | POA: Insufficient documentation

## 2020-11-21 DIAGNOSIS — Z79899 Other long term (current) drug therapy: Secondary | ICD-10-CM | POA: Insufficient documentation

## 2020-11-21 HISTORY — PX: TONSILLECTOMY AND ADENOIDECTOMY: SHX28

## 2020-11-21 HISTORY — DX: Carpal tunnel syndrome, bilateral upper limbs: G56.03

## 2020-11-21 LAB — GLUCOSE, CAPILLARY
Glucose-Capillary: 110 mg/dL — ABNORMAL HIGH (ref 70–99)
Glucose-Capillary: 111 mg/dL — ABNORMAL HIGH (ref 70–99)

## 2020-11-21 SURGERY — TONSILLECTOMY AND ADENOIDECTOMY
Anesthesia: General | Site: Mouth | Laterality: Bilateral

## 2020-11-21 MED ORDER — SUCCINYLCHOLINE CHLORIDE 20 MG/ML IJ SOLN
INTRAMUSCULAR | Status: DC | PRN
  Administered 2020-11-21: 120 mg via INTRAVENOUS

## 2020-11-21 MED ORDER — ROCURONIUM BROMIDE 100 MG/10ML IV SOLN
INTRAVENOUS | Status: DC | PRN
  Administered 2020-11-21: 10 mg via INTRAVENOUS

## 2020-11-21 MED ORDER — DEXMEDETOMIDINE (PRECEDEX) IN NS 20 MCG/5ML (4 MCG/ML) IV SYRINGE
PREFILLED_SYRINGE | INTRAVENOUS | Status: DC | PRN
  Administered 2020-11-21: 10 ug via INTRAVENOUS

## 2020-11-21 MED ORDER — LIDOCAINE HCL (CARDIAC) PF 100 MG/5ML IV SOSY
PREFILLED_SYRINGE | INTRAVENOUS | Status: DC | PRN
  Administered 2020-11-21: 60 mg via INTRAVENOUS

## 2020-11-21 MED ORDER — MIDAZOLAM HCL 5 MG/5ML IJ SOLN
INTRAMUSCULAR | Status: DC | PRN
  Administered 2020-11-21: 2 mg via INTRAVENOUS

## 2020-11-21 MED ORDER — PROPOFOL 10 MG/ML IV BOLUS
INTRAVENOUS | Status: DC | PRN
  Administered 2020-11-21: 200 mg via INTRAVENOUS

## 2020-11-21 MED ORDER — LACTATED RINGERS IV SOLN: INTRAVENOUS | Status: DC

## 2020-11-21 MED ORDER — OXYMETAZOLINE HCL 0.05 % NA SOLN
NASAL | Status: DC | PRN
  Administered 2020-11-21: 1 via TOPICAL

## 2020-11-21 MED ORDER — FENTANYL CITRATE (PF) 100 MCG/2ML IJ SOLN
25.0000 ug | INTRAMUSCULAR | Status: DC | PRN
  Administered 2020-11-21: 25 ug via INTRAVENOUS
  Administered 2020-11-21: 50 ug via INTRAVENOUS
  Administered 2020-11-21: 25 ug via INTRAVENOUS

## 2020-11-21 MED ORDER — ONDANSETRON HCL 4 MG/2ML IJ SOLN: 4.0000 mg | Freq: Once | INTRAMUSCULAR | Status: DC | PRN

## 2020-11-21 MED ORDER — ONDANSETRON HCL 4 MG/2ML IJ SOLN
INTRAMUSCULAR | Status: DC | PRN
  Administered 2020-11-21: 4 mg via INTRAVENOUS

## 2020-11-21 MED ORDER — DEXAMETHASONE SODIUM PHOSPHATE 4 MG/ML IJ SOLN
INTRAMUSCULAR | Status: DC | PRN
  Administered 2020-11-21: 8 mg via INTRAVENOUS

## 2020-11-21 MED ORDER — HYDROCODONE-ACETAMINOPHEN 7.5-325 MG/15ML PO SOLN: ORAL | 0 refills | Status: DC

## 2020-11-21 MED ORDER — SUGAMMADEX SODIUM 200 MG/2ML IV SOLN
INTRAVENOUS | Status: DC | PRN
  Administered 2020-11-21: 200 mg via INTRAVENOUS

## 2020-11-21 MED ORDER — BUPIVACAINE HCL 0.25 % IJ SOLN
INTRAMUSCULAR | Status: DC | PRN
  Administered 2020-11-21: 5 mL

## 2020-11-21 MED ORDER — ACETAMINOPHEN 10 MG/ML IV SOLN
1000.0000 mg | Freq: Once | INTRAVENOUS | Status: AC
  Administered 2020-11-21: 1000 mg via INTRAVENOUS

## 2020-11-21 MED ORDER — SCOPOLAMINE 1 MG/3DAYS TD PT72
1.0000 | MEDICATED_PATCH | Freq: Once | TRANSDERMAL | Status: DC
  Administered 2020-11-21: 1.5 mg via TRANSDERMAL

## 2020-11-21 MED ORDER — OXYCODONE HCL 5 MG/5ML PO SOLN
10.0000 mg | Freq: Once | ORAL | Status: AC
  Administered 2020-11-21: 5 mg via ORAL

## 2020-11-21 MED ORDER — FENTANYL CITRATE (PF) 100 MCG/2ML IJ SOLN
INTRAMUSCULAR | Status: DC | PRN
  Administered 2020-11-21 (×2): 50 ug via INTRAVENOUS

## 2020-11-21 SURGICAL SUPPLY — 14 items
BLADE BOVIE TIP EXT 4 (BLADE) ×2 IMPLANT
CANISTER SUCT 1200ML W/VALVE (MISCELLANEOUS) ×2 IMPLANT
CATH ROBINSON RED A/P 10FR (CATHETERS) ×2 IMPLANT
COAG SUCT 10F 3.5MM HAND CTRL (MISCELLANEOUS) ×2 IMPLANT
ELECT REM PT RETURN 9FT ADLT (ELECTROSURGICAL) ×2
ELECTRODE REM PT RTRN 9FT ADLT (ELECTROSURGICAL) ×1 IMPLANT
GLOVE SURG ENC MOIS LTX SZ7.5 (GLOVE) ×2 IMPLANT
KIT TURNOVER KIT A (KITS) ×2 IMPLANT
NS IRRIG 500ML POUR BTL (IV SOLUTION) ×2 IMPLANT
PACK TONSIL AND ADENOID CUSTOM (PACKS) ×2 IMPLANT
PENCIL SMOKE EVACUATOR (MISCELLANEOUS) ×2 IMPLANT
SLEEVE SUCTION 125 (MISCELLANEOUS) ×2 IMPLANT
SOL ANTI-FOG 6CC FOG-OUT (MISCELLANEOUS) ×1 IMPLANT
SOL FOG-OUT ANTI-FOG 6CC (MISCELLANEOUS) ×1

## 2020-11-21 NOTE — Anesthesia Procedure Notes (Signed)
Procedure Name: Intubation Date/Time: 11/21/2020 7:51 AM Performed by: Dionne Bucy, CRNA Pre-anesthesia Checklist: Patient identified, Patient being monitored, Timeout performed, Emergency Drugs available and Suction available Patient Re-evaluated:Patient Re-evaluated prior to induction Oxygen Delivery Method: Circle system utilized Preoxygenation: Pre-oxygenation with 100% oxygen Induction Type: IV induction Ventilation: Mask ventilation without difficulty Laryngoscope Size: Mac and 3 Grade View: Grade I Tube type: Oral Rae Tube size: 7.0 mm Number of attempts: 1 Placement Confirmation: ETT inserted through vocal cords under direct vision,  positive ETCO2 and breath sounds checked- equal and bilateral Secured at: 21 cm Tube secured with: Tape Dental Injury: Teeth and Oropharynx as per pre-operative assessment

## 2020-11-21 NOTE — H&P (Signed)
History and physical reviewed and will be scanned in later. No change in medical status reported by the patient or family, appears stable for surgery. All questions regarding the procedure answered, and patient (or family if a child) expressed understanding of the procedure. ? ?Julie Estrada S Julie Estrada ?@TODAY@ ?

## 2020-11-21 NOTE — Anesthesia Preprocedure Evaluation (Signed)
Anesthesia Evaluation  Patient identified by MRN, date of birth, ID band Patient awake    Reviewed: Allergy & Precautions, H&P , NPO status , Patient's Chart, lab work & pertinent test results  History of Anesthesia Complications (+) PONV and history of anesthetic complications  Airway Mallampati: II  TM Distance: >3 FB Neck ROM: full    Dental no notable dental hx.    Pulmonary Patient abstained from smoking., former smoker,    Pulmonary exam normal breath sounds clear to auscultation       Cardiovascular hypertension, Normal cardiovascular exam Rhythm:regular Rate:Normal     Neuro/Psych  Headaches, PSYCHIATRIC DISORDERS    GI/Hepatic   Endo/Other  diabetesMorbid obesity  Renal/GU      Musculoskeletal   Abdominal   Peds  Hematology   Anesthesia Other Findings   Reproductive/Obstetrics                             Anesthesia Physical Anesthesia Plan  ASA: III  Anesthesia Plan: General ETT   Post-op Pain Management:    Induction:   PONV Risk Score and Plan: 4 or greater and Treatment may vary due to age or medical condition, Ondansetron, Dexamethasone, Scopolamine patch - Pre-op and Midazolam  Airway Management Planned:   Additional Equipment:   Intra-op Plan:   Post-operative Plan:   Informed Consent: I have reviewed the patients History and Physical, chart, labs and discussed the procedure including the risks, benefits and alternatives for the proposed anesthesia with the patient or authorized representative who has indicated his/her understanding and acceptance.     Dental Advisory Given  Plan Discussed with: CRNA  Anesthesia Plan Comments:         Anesthesia Quick Evaluation

## 2020-11-21 NOTE — Transfer of Care (Signed)
Immediate Anesthesia Transfer of Care Note  Patient: Julie Estrada  Procedure(s) Performed: TONSILLECTOMY AND  ADENOIDECTOMY (Bilateral Mouth)  Patient Location: PACU  Anesthesia Type: General ETT  Level of Consciousness: awake, alert  and patient cooperative  Airway and Oxygen Therapy: Patient Spontanous Breathing and Patient connected to supplemental oxygen  Post-op Assessment: Post-op Vital signs reviewed, Patient's Cardiovascular Status Stable, Respiratory Function Stable, Patent Airway and No signs of Nausea or vomiting  Post-op Vital Signs: Reviewed and stable  Complications: No complications documented.

## 2020-11-21 NOTE — Op Note (Signed)
11/21/2020  8:20 AM    [name]  [id number]   Pre-Op Diagnosis:  tonsillolithiasis, tonsil hyperplasia Post-op Diagnosis: Tonsillolithiasis, adenotonsillar hyperplasia  Procedure: Adenotonsillectomy  Surgeon: Riley Nearing., MD  Anesthesia:  General endotracheal  EBL:  Less than 25 cc  Complications:  None  Findings: 3+ tonsils, moderately large adenoids with inflammation  Procedure: The patient was taken to the Operating Room and placed in the supine position.  After induction of general endotracheal anesthesia, the table was turned 90 degrees and the patient was draped in the usual fashion for adenoidectomy with the eyes protected.  A mouth gag was inserted into the oral cavity to open the mouth, and examination of the oropharynx showed the uvula was non-bifid. The palate was palpated, and there was no evidence of submucous cleft.  A red rubber catheter was placed through the nostril and used to retract the palate.  Examination of the nasopharynx showed moderately obstructing adenoids.  Under indirect vision with the mirror, an adenotome was placed in the nasopharynx.  The adenoids were curetted free.  Reinspection with a mirror showed excellent removal of the adenoids.  Afrin moistened nasopharyngeal packs were then placed to control bleeding.  The nasopharyngeal packs were removed.  Suction cautery was then used to cauterize the nasopharyngeal bed to obtain hemostasis.   The right tonsil was grasped with an Allis clamp and resected from the tonsillar fossa in the usual fashion with the Bovie. The left tonsil was resected in the same fashion. The Bovie was used to obtain hemostasis. Each tonsillar fossa was then carefully injected with 0.25% marcaine, avoiding intravascular injection. The nose and throat were irrigated and suctioned to remove any adenoid debris or blood clot. The red rubber catheter and mouth gag were  removed with no evidence of active bleeding.  The patient  was then returned to the anesthesiologist for awakening, and was taken to the Recovery Room in stable condition.  Cultures:  None.  Specimens:  Adenoids and tonsils.  Disposition:   PACU to home  Plan: Soft, bland diet and push fluids. Take pain medications and prednisone as prescribed. No strenuous activity for 2 weeks. Follow-up in 3 weeks.  Riley Nearing 11/21/2020 8:20 AM

## 2020-11-21 NOTE — Discharge Instructions (Signed)
T & A INSTRUCTION SHEET - [facility]  P. Malon Kindle, MD  [address] [place], [place] [address] TEL. [phone] [address] [place] [address] [phone]  INFORMATION SHEET FOR A TONSILLECTOMY AND ADENDOIDECTOMY  About Your Tonsils and Adenoids The tonsils and adenoids are normal body tissues that are part of our immune system. They normally help to protect [place] against diseases that may enter our mouth and nose. However, sometimes the tonsils and/or adenoids become too large and obstruct our breathing, especially at night.  If either of these things happen it helps to remove the tonsils and adenoids in order to become healthier. The operation to remove the tonsils and adenoids is called a tonsillectomy and adenoidectomy.  The Location of Your Tonsils and Adenoids The tonsils are located in the back of the throat on both side and sit in a cradle of muscles. The adenoids are located in the roof of the mouth, behind the nose, and closely associated with the opening of the Eustachian tube to the ear.  Surgery on Tonsils and Adenoids A tonsillectomy and adenoidectomy is a short operation which takes about thirty minutes. This includes being put to sleep and being awakened. Tonsillectomies and adenoidectomies are performed at [facility] and may require observation period in the recovery room prior to going home. Children are required to remain in the recovery area for 45 minutes after surgery.  Following the Operation for a Tonsillectomy A cautery machine is used to control bleeding.  Bleeding from a tonsillectomy and adenoidectomy is minimal and postoperatively the risk of bleeding is approximately four percent, although this rarely life threatening.  After your tonsillectomy and adenoidectomy post-op care at home: 1. Our patients are able to go home the same day. You may be given prescriptions for  pain medications and antibiotics, if indicated. 2. It is extremely important to remember that fluid intake is of utmost importance after a tonsillectomy. The amount that you drink must be maintained in the postoperative period. A good indication of whether a child is getting enough fluid is whether his/her urine output is constant.  As long as children are urinating or wetting their diaper every 6 - 8 hours this is usually enough fluid intake.   3. Although rare, this is a risk of some bleeding in the first ten days after surgery. This usually occurs between day five and nine postoperatively. This risk of bleeding is approximately four percent.  If you or your child should have any bleeding you should remain calm and notify our office or go directly to the Emergency Room at [facility] where they will contact us. Our doctors are available seven days a week for notification. We recommend sitting up quietly in a chair, place an ice pack on the front of the neck and spitting out the blood gently until we are able to contact you. Adults should gargle gently with ice water and this may help stop the bleeding. If the bleeding does not stop after a short time, i.e. 10 to 15 minutes, or seems to be increasing again, please contact us or go to the hospital.   4. It is common for the pain to be worse at 5 - 7 days postoperatively. This occurs because the "scab" is peeling off and the mucous membrane (skin of the throat) is growing back where the tonsils were.   5. It is common for a low-grade fever,  less than 102, during the first week after a tonsillectomy and adenoidectomy. It is usually due to not drinking enough liquids, and we suggest your use liquid Tylenol (acetaminophen) or the pain medicine with Tylenol (acetaminophen) prescribed in order to keep your temperature below 102. Please follow the directions on the back of the bottle. 6. Do not take aspirin or any products that contain aspirin  such as Bufferin, Anacin, Ecotrin, aspirin gum, Goodies, BC headache powders, etc., after a T&A because it can promote bleeding.  DO NOT TAKE MOTRIN OR IBUPROFEN. Please check with our office before administering any other medication that may been prescribed by other doctors during the two-week post-operative period. 7. If you happen to look in the mirror or into your child's mouth you will see white/gray patches on the back of the throat.  This is what a scab looks like in the mouth and is normal after having a tonsillectomy and adenoidectomy. It will disappear once the tonsil area heals completely. However, it may cause a noticeable odor, and this too will disappear with time.     8. You or your child may experience ear pain after having a tonsillectomy and adenoidectomy. This is called referred pain and comes from the throat, but it is felt in the ears. Ear pain is quite common and expected. It will usually go away after ten days. There is usually nothing wrong with the ears, and it is primarily due to the healing area stimulating the nerve to the ear that runs along the side of the throat. Use either the prescribed pain medicine or Tylenol (acetaminophen) as needed.  9. The throat tissues after a tonsillectomy are obviously sensitive. Smoking around children who have had a tonsillectomy significantly increases the risk of bleeding.  DO NOT SMOKE! What to Expect Each Day  First Day at Home 1. Patients will be discharged home the same day.  2. Drink at least four glasses of liquid a day. Clear, cool liquids are recommended. Fruit juices containing citric acid are not recommended because they tend to cause pain. Carbonated beverages are allowed if you pour them from glass to glass to remove the bubbles as these tend to cause discomfort. Avoid alcoholic beverages.  3. Eat very soft foods such as soups, broth, jello, custard, pudding, ice cream, popsicles, applesauce, mashed potatoes, and in general anything  that you can crush between your tongue and the roof of your mouth. Try adding [employer] Mix into your food for extra calories. It is not uncommon to lose 5 to 10 pounds of fluid weight. The weight will be gained back quickly once you're feeling better and drinking more.  4. Sleep with your head elevated on two pillows for about three days to help decrease the swelling.  5. DO NOT SMOKE!  Day Two  1. Rest as much as possible. Use common sense in your activities.  2. Continue drinking at least four glasses of liquid per day.  3. Follow the soft diet.  4. Use your pain medication as needed.  Day Three  1. Advance your activity as you are able and continue to follow the previous day's suggestions.  Days Four Through Six  1. Advance your diet and begin to eat more solid foods such as chopped hamburger. 2. Advance your activities slowly. Children should be kept mostly around the house.  3. Not uncommonly, there will be more pain at this time. It is temporary, usually lasting a day or two.  Day Seven  Through Ten  1. Most individuals by this time are able to return to work or school unless otherwise instructed. Consider sending children back to school for a half day on the first day back.  General Anesthesia, Adult, Care After This sheet gives you information about how to care for yourself after your procedure. Your health care provider may also give you more specific instructions. If you have problems or questions, contact your health care provider. What can I expect after the procedure? After the procedure, the following side effects are common:  Pain or discomfort at the IV site.  Nausea.  Vomiting.  Sore throat.  Trouble concentrating.  Feeling cold or chills.  Feeling weak or tired.  Sleepiness and fatigue.  Soreness and body aches. These side effects can affect parts of the body that were not involved in surgery. Follow these instructions at home: For the time  period you were told by your health care provider:  Rest.  Do not participate in activities where you could fall or become injured.  Do not drive or use machinery.  Do not drink alcohol.  Do not take sleeping pills or medicines that cause drowsiness.  Do not make important decisions or sign legal documents.  Do not take care of children on your own.   Eating and drinking  Follow any instructions from your health care provider about eating or drinking restrictions.  When you feel hungry, start by eating small amounts of foods that are soft and easy to digest (bland), such as toast. Gradually return to your regular diet.  Drink enough fluid to keep your urine pale yellow.  If you vomit, rehydrate by drinking water, juice, or clear broth. General instructions  If you have sleep apnea, surgery and certain medicines can increase your risk for breathing problems. Follow instructions from your health care provider about wearing your sleep device: ? Anytime you are sleeping, including during daytime naps. ? While taking prescription pain medicines, sleeping medicines, or medicines that make you drowsy.  Have a responsible adult stay with you for the time you are told. It is important to have someone help care for you until you are awake and alert.  Return to your normal activities as told by your health care provider. Ask your health care provider what activities are safe for you.  Take over-the-counter and prescription medicines only as told by your health care provider.  If you smoke, do not smoke without supervision.  Keep all follow-up visits as told by your health care provider. This is important. Contact a health care provider if:  You have nausea or vomiting that does not get better with medicine.  You cannot eat or drink without vomiting.  You have pain that does not get better with medicine.  You are unable to pass urine.  You develop a skin rash.  You have a  fever.  You have redness around your IV site that gets worse. Get help right away if:  You have difficulty breathing.  You have chest pain.  You have blood in your urine or stool, or you vomit blood. Summary  After the procedure, it is common to have a sore throat or nausea. It is also common to feel tired.  Have a responsible adult stay with you for the time you are told. It is important to have someone help care for you until you are awake and alert.  When you feel hungry, start by eating small amounts of foods that are soft  and easy to digest (bland), such as toast. Gradually return to your regular diet.  Drink enough fluid to keep your urine pale yellow.  Return to your normal activities as told by your health care provider. Ask your health care provider what activities are safe for you. This information is not intended to replace advice given to you by your health care provider. Make sure you discuss any questions you have with your health care provider. Document Revised: 05/25/2020 Document Reviewed: 12/23/2019 Elsevier Patient Education  2021 [place].  Scopolamine skin patches Remove in 72 hrs. Wash hands immediately after removal. What is this medicine? SCOPOLAMINE (skoe POL a meen) is used to prevent nausea and vomiting caused by motion sickness, anesthesia and surgery. This medicine may be used for other purposes; ask your health care provider or pharmacist if you have questions. COMMON BRAND NAME(S): Transderm Scop What should I tell my health care provider before I take this medicine? They need to know if you have any of these conditions:  are scheduled to have a gastric secretion test  glaucoma  heart disease  kidney disease  liver disease  lung or breathing disease, like asthma  mental illness  prostate disease  seizures  stomach or intestine problems  trouble passing urine  an unusual or allergic reaction to scopolamine, atropine, other  medicines, foods, dyes, or preservatives  pregnant or trying to get pregnant  breast-feeding How should I use this medicine? This medicine is for external use only. Follow the directions on the prescription label. Wear only 1 patch at a time. Choose an area behind the ear, that is clean, dry, hairless and free from any cuts or irritation. Wipe the area with a clean dry tissue. Peel off the plastic backing of the skin patch, trying not to touch the adhesive side with your hands. Do not cut the patches. Firmly apply to the area you have chosen, with the metallic side of the patch to the skin and the tan-colored side showing. Once firmly in place, wash your hands well with soap and water. Do not get this medicine into your eyes. After removing the patch, wash your hands and the area behind your ear thoroughly with soap and water. The patch will still contain some medicine after use. To avoid accidental contact or ingestion by children or pets, fold the used patch in half with the sticky side together and throw away in the trash out of the reach of children and pets. If you need to use a second patch after you remove the first, place it behind the other ear. A special MedGuide will be given to you by the pharmacist with each prescription and refill. Be sure to read this information carefully each time. Talk to your pediatrician regarding the use of this medicine in children. Special care may be needed. Overdosage: If you think you have taken too much of this medicine contact a poison control center or emergency room at once. NOTE: This medicine is only for you. Do not share this medicine with others. What if I miss a dose? This does not apply. This medicine is not for regular use. What may interact with this medicine?  alcohol  antihistamines for allergy cough and cold  atropine  certain medicines for anxiety or sleep  certain medicines for bladder problems like oxybutynin, tolterodine  certain  medicines for depression like amitriptyline, fluoxetine, sertraline  certain medicines for stomach problems like dicyclomine, hyoscyamine  certain medicines for Parkinson's disease like benztropine, trihexyphenidyl  certain medicines for seizures like phenobarbital, primidone  general anesthetics like halothane, isoflurane, methoxyflurane, propofol  ipratropium  local anesthetics like lidocaine, pramoxine, tetracaine  medicines that relax muscles for surgery  phenothiazines like chlorpromazine, mesoridazine, prochlorperazine, thioridazine  narcotic medicines for pain  other belladonna alkaloids This list may not describe all possible interactions. Give your health care provider a list of all the medicines, herbs, non-prescription drugs, or dietary supplements you use. Also tell them if you smoke, drink alcohol, or use illegal drugs. Some items may interact with your medicine. What should I watch for while using this medicine? Limit contact with water while swimming and bathing because the patch may fall off. If the patch falls off, throw it away and put a new one behind the other ear. You may get drowsy or dizzy. Do not drive, use machinery, or do anything that needs mental alertness until you know how this medicine affects you. Do not stand or sit up quickly, especially if you are an older patient. This reduces the risk of dizzy or fainting spells. Alcohol may interfere with the effect of this medicine. Avoid alcoholic drinks. Your mouth may get dry. Chewing sugarless gum or sucking hard candy, and drinking plenty of water may help. Contact your healthcare professional if the problem does not go away or is severe. This medicine may cause dry eyes and blurred vision. If you wear contact lenses, you may feel some discomfort. Lubricating drops may help. See your healthcare professional if the problem does not go away or is severe. If you are going to need surgery, an MRI, CT scan, or other  procedure, tell your healthcare professional that you are using this medicine. You may need to remove the patch before the procedure. What side effects may I notice from receiving this medicine? Side effects that you should report to your doctor or health care professional as soon as possible:  allergic reactions like skin rash, itching or hives; swelling of the face, lips, or tongue  blurred vision  changes in vision  confusion  dizziness  eye pain  fast, irregular heartbeat  hallucinations, loss of contact with reality  nausea, vomiting  pain or trouble passing urine  restlessness  seizures  skin irritation  stomach pain Side effects that usually do not require medical attention (report to your doctor or health care professional if they continue or are bothersome):  drowsiness  dry mouth  headache  sore throat This list may not describe all possible side effects. Call your doctor for medical advice about side effects. You may report side effects to FDA at 1-800-FDA-1088. Where should I keep my medicine? Keep out of the reach of children. Store at room temperature between 20 and 25 degrees C (68 and 77 degrees F). Keep this medicine in the foil package until ready to use. Throw away any unused medicine after the expiration date. NOTE: This sheet is a summary. It may not cover all possible information. If you have questions about this medicine, talk to your doctor, pharmacist, or health care provider.  2021 Elsevier/Gold Standard (2017-11-28 16:14:46)

## 2020-11-21 NOTE — Anesthesia Postprocedure Evaluation (Signed)
Anesthesia Post Note  Patient: Julie Estrada  Procedure(s) Performed: TONSILLECTOMY AND  ADENOIDECTOMY (Bilateral Mouth)     Patient location during evaluation: PACU Anesthesia Type: General Level of consciousness: awake and alert and oriented Pain management: satisfactory to patient Vital Signs Assessment: post-procedure vital signs reviewed and stable Respiratory status: spontaneous breathing, nonlabored ventilation and respiratory function stable Cardiovascular status: blood pressure returned to baseline and stable Postop Assessment: Adequate PO intake and No signs of nausea or vomiting Anesthetic complications: no   No complications documented.  Raliegh Ip

## 2020-11-22 ENCOUNTER — Encounter: Payer: Self-pay | Admitting: Otolaryngology

## 2020-11-22 LAB — SURGICAL PATHOLOGY

## 2021-03-29 ENCOUNTER — Ambulatory Visit: Payer: BC Managed Care – PPO | Admitting: Obstetrics and Gynecology

## 2021-05-04 ENCOUNTER — Ambulatory Visit: Payer: BC Managed Care – PPO | Admitting: Obstetrics and Gynecology

## 2021-05-21 ENCOUNTER — Ambulatory Visit: Payer: BC Managed Care – PPO | Admitting: Obstetrics and Gynecology

## 2021-07-05 ENCOUNTER — Other Ambulatory Visit: Payer: Self-pay

## 2021-07-05 ENCOUNTER — Encounter: Payer: Self-pay | Admitting: Emergency Medicine

## 2021-07-05 ENCOUNTER — Emergency Department: Payer: BC Managed Care – PPO

## 2021-07-05 ENCOUNTER — Emergency Department
Admission: EM | Admit: 2021-07-05 | Discharge: 2021-07-05 | Disposition: A | Discharge status: Home / Self Care | Payer: BC Managed Care – PPO | Attending: Emergency Medicine | Admitting: Emergency Medicine

## 2021-07-05 DIAGNOSIS — S7002XA Contusion of left hip, initial encounter: Secondary | ICD-10-CM | POA: Insufficient documentation

## 2021-07-05 DIAGNOSIS — S5012XA Contusion of left forearm, initial encounter: Secondary | ICD-10-CM | POA: Insufficient documentation

## 2021-07-05 DIAGNOSIS — Z7984 Long term (current) use of oral hypoglycemic drugs: Secondary | ICD-10-CM | POA: Insufficient documentation

## 2021-07-05 DIAGNOSIS — S6992XA Unspecified injury of left wrist, hand and finger(s), initial encounter: Secondary | ICD-10-CM | POA: Diagnosis present

## 2021-07-05 DIAGNOSIS — Y92410 Unspecified street and highway as the place of occurrence of the external cause: Secondary | ICD-10-CM | POA: Insufficient documentation

## 2021-07-05 DIAGNOSIS — M25552 Pain in left hip: Secondary | ICD-10-CM

## 2021-07-05 DIAGNOSIS — Y9389 Activity, other specified: Secondary | ICD-10-CM | POA: Diagnosis not present

## 2021-07-05 DIAGNOSIS — Z87891 Personal history of nicotine dependence: Secondary | ICD-10-CM | POA: Insufficient documentation

## 2021-07-05 DIAGNOSIS — M79644 Pain in right finger(s): Secondary | ICD-10-CM

## 2021-07-05 DIAGNOSIS — S60221A Contusion of right hand, initial encounter: Secondary | ICD-10-CM | POA: Insufficient documentation

## 2021-07-05 MED ORDER — NAPROXEN 500 MG PO TABS: 500.0000 mg | ORAL_TABLET | Freq: Two times a day (BID) | ORAL | 0 refills | Status: DC

## 2021-07-05 MED ORDER — HYDROCODONE-ACETAMINOPHEN 5-325 MG PO TABS: 1.0000 | ORAL_TABLET | Freq: Four times a day (QID) | ORAL | 0 refills | Status: AC | PRN

## 2021-07-05 MED ORDER — CYCLOBENZAPRINE HCL 10 MG PO TABS: 10.0000 mg | ORAL_TABLET | Freq: Three times a day (TID) | ORAL | 0 refills | Status: DC | PRN
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
## 2021-07-05 NOTE — Discharge Instructions (Signed)
Follow up with orthopedics if not improving over the week.  Return to the ER for symptoms that change or worsen or for new concerns.

## 2021-07-08 ENCOUNTER — Encounter: Payer: Self-pay | Admitting: Emergency Medicine

## 2021-07-08 ENCOUNTER — Emergency Department: Payer: BC Managed Care – PPO

## 2021-07-08 ENCOUNTER — Other Ambulatory Visit: Payer: Self-pay

## 2021-07-08 ENCOUNTER — Emergency Department
Admission: EM | Admit: 2021-07-08 | Discharge: 2021-07-08 | Disposition: A | Discharge status: Home / Self Care | Payer: BC Managed Care – PPO | Attending: Emergency Medicine | Admitting: Emergency Medicine

## 2021-07-08 DIAGNOSIS — R1030 Lower abdominal pain, unspecified: Secondary | ICD-10-CM | POA: Diagnosis present

## 2021-07-08 DIAGNOSIS — Z87891 Personal history of nicotine dependence: Secondary | ICD-10-CM | POA: Diagnosis not present

## 2021-07-08 DIAGNOSIS — R197 Diarrhea, unspecified: Secondary | ICD-10-CM

## 2021-07-08 DIAGNOSIS — K529 Noninfective gastroenteritis and colitis, unspecified: Secondary | ICD-10-CM | POA: Insufficient documentation

## 2021-07-08 LAB — CBC WITH DIFFERENTIAL/PLATELET
Abs Immature Granulocytes: 0.02 10*3/uL (ref 0.00–0.07)
Basophils Absolute: 0 10*3/uL (ref 0.0–0.1)
Basophils Relative: 0 %
Eosinophils Absolute: 0.1 10*3/uL (ref 0.0–0.5)
Eosinophils Relative: 2 %
HCT: 40.1 % (ref 36.0–46.0)
Hemoglobin: 13.6 g/dL (ref 12.0–15.0)
Immature Granulocytes: 0 %
Lymphocytes Relative: 21 %
Lymphs Abs: 1.2 10*3/uL (ref 0.7–4.0)
MCH: 28.8 pg (ref 26.0–34.0)
MCHC: 33.9 g/dL (ref 30.0–36.0)
MCV: 85 fL (ref 80.0–100.0)
Monocytes Absolute: 0.4 10*3/uL (ref 0.1–1.0)
Monocytes Relative: 6 %
Neutro Abs: 4.1 10*3/uL (ref 1.7–7.7)
Neutrophils Relative %: 71 %
Platelets: 299 10*3/uL (ref 150–400)
RBC: 4.72 MIL/uL (ref 3.87–5.11)
RDW: 12.7 % (ref 11.5–15.5)
WBC: 5.9 10*3/uL (ref 4.0–10.5)
nRBC: 0 % (ref 0.0–0.2)

## 2021-07-08 LAB — URINALYSIS, COMPLETE (UACMP) WITH MICROSCOPIC
Bilirubin Urine: NEGATIVE
Glucose, UA: NEGATIVE mg/dL
Hgb urine dipstick: NEGATIVE
Ketones, ur: NEGATIVE mg/dL
Leukocytes,Ua: NEGATIVE
Nitrite: NEGATIVE
Protein, ur: NEGATIVE mg/dL
Specific Gravity, Urine: 1.014 (ref 1.005–1.030)
pH: 5 (ref 5.0–8.0)

## 2021-07-08 LAB — COMPREHENSIVE METABOLIC PANEL
ALT: 31 U/L (ref 0–44)
AST: 21 U/L (ref 15–41)
Albumin: 4.7 g/dL (ref 3.5–5.0)
Alkaline Phosphatase: 32 U/L — ABNORMAL LOW (ref 38–126)
Anion gap: 10 (ref 5–15)
BUN: 20 mg/dL (ref 6–20)
CO2: 18 mmol/L — ABNORMAL LOW (ref 22–32)
Calcium: 9.5 mg/dL (ref 8.9–10.3)
Chloride: 107 mmol/L (ref 98–111)
Creatinine, Ser: 0.7 mg/dL (ref 0.44–1.00)
GFR, Estimated: >60 mL/min (ref >60)
Glucose, Bld: 97 mg/dL (ref 70–99)
Potassium: 3.7 mmol/L (ref 3.5–5.1)
Sodium: 135 mmol/L (ref 135–145)
Total Bilirubin: 0.6 mg/dL (ref 0.3–1.2)
Total Protein: 8.1 g/dL (ref 6.5–8.1)

## 2021-07-08 LAB — PREGNANCY, URINE: Preg Test, Ur: NEGATIVE

## 2021-07-08 MED ORDER — LACTATED RINGERS IV BOLUS
1000.0000 mL | Freq: Once | INTRAVENOUS | Status: AC
  Administered 2021-07-08: 1000 mL via INTRAVENOUS

## 2021-07-08 MED ORDER — ONDANSETRON 4 MG PO TBDP: 4.0000 mg | ORAL_TABLET | Freq: Three times a day (TID) | ORAL | 0 refills | Status: DC | PRN

## 2021-07-08 MED ORDER — ONDANSETRON HCL 4 MG/2ML IJ SOLN
4.0000 mg | Freq: Once | INTRAMUSCULAR | Status: AC
  Administered 2021-07-08: 4 mg via INTRAVENOUS
  Filled 2021-07-08: qty 2
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
## 2021-07-08 NOTE — ED Notes (Signed)
Bladder scan result os 114ml in bladder.
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
## 2021-07-23 ENCOUNTER — Other Ambulatory Visit: Payer: Self-pay | Admitting: Orthopedic Surgery

## 2021-07-23 DIAGNOSIS — M25531 Pain in right wrist: Secondary | ICD-10-CM

## 2021-07-23 DIAGNOSIS — M25541 Pain in joints of right hand: Secondary | ICD-10-CM

## 2021-07-26 ENCOUNTER — Ambulatory Visit
Admission: RE | Admit: 2021-07-26 | Discharge: 2021-07-26 | Disposition: A | Discharge status: Home / Self Care | Payer: BC Managed Care – PPO | Source: Ambulatory Visit | Attending: Orthopedic Surgery | Admitting: Orthopedic Surgery

## 2021-07-26 ENCOUNTER — Other Ambulatory Visit: Payer: Self-pay

## 2021-07-26 DIAGNOSIS — M25541 Pain in joints of right hand: Secondary | ICD-10-CM | POA: Insufficient documentation

## 2021-07-26 DIAGNOSIS — M25531 Pain in right wrist: Secondary | ICD-10-CM | POA: Diagnosis not present

## 2021-09-25 DIAGNOSIS — J4520 Mild intermittent asthma, uncomplicated: Secondary | ICD-10-CM | POA: Insufficient documentation

## 2021-11-30 ENCOUNTER — Other Ambulatory Visit: Payer: Self-pay

## 2021-11-30 ENCOUNTER — Observation Stay
Admission: EM | Admit: 2021-11-30 | Discharge: 2021-12-01 | Disposition: A | Discharge status: Home / Self Care | Payer: BC Managed Care – PPO | Attending: Internal Medicine | Admitting: Internal Medicine

## 2021-11-30 ENCOUNTER — Encounter: Payer: Self-pay | Admitting: Emergency Medicine

## 2021-11-30 DIAGNOSIS — E669 Obesity, unspecified: Secondary | ICD-10-CM | POA: Diagnosis present

## 2021-11-30 DIAGNOSIS — Z87891 Personal history of nicotine dependence: Secondary | ICD-10-CM | POA: Insufficient documentation

## 2021-11-30 DIAGNOSIS — E66812 Obesity, class 2: Secondary | ICD-10-CM | POA: Diagnosis present

## 2021-11-30 DIAGNOSIS — I10 Essential (primary) hypertension: Secondary | ICD-10-CM | POA: Diagnosis present

## 2021-11-30 DIAGNOSIS — E119 Type 2 diabetes mellitus without complications: Secondary | ICD-10-CM

## 2021-11-30 DIAGNOSIS — E162 Hypoglycemia, unspecified: Secondary | ICD-10-CM | POA: Diagnosis present

## 2021-11-30 DIAGNOSIS — F411 Generalized anxiety disorder: Secondary | ICD-10-CM | POA: Diagnosis present

## 2021-11-30 DIAGNOSIS — Z90710 Acquired absence of both cervix and uterus: Secondary | ICD-10-CM | POA: Diagnosis present

## 2021-11-30 DIAGNOSIS — Z79899 Other long term (current) drug therapy: Secondary | ICD-10-CM | POA: Insufficient documentation

## 2021-11-30 DIAGNOSIS — F4310 Post-traumatic stress disorder, unspecified: Secondary | ICD-10-CM | POA: Diagnosis present

## 2021-11-30 DIAGNOSIS — K219 Gastro-esophageal reflux disease without esophagitis: Secondary | ICD-10-CM | POA: Diagnosis present

## 2021-11-30 DIAGNOSIS — Z7984 Long term (current) use of oral hypoglycemic drugs: Secondary | ICD-10-CM | POA: Insufficient documentation

## 2021-11-30 DIAGNOSIS — R55 Syncope and collapse: Principal | ICD-10-CM | POA: Diagnosis present

## 2021-11-30 DIAGNOSIS — E11649 Type 2 diabetes mellitus with hypoglycemia without coma: Secondary | ICD-10-CM | POA: Insufficient documentation

## 2021-11-30 DIAGNOSIS — Z20822 Contact with and (suspected) exposure to covid-19: Secondary | ICD-10-CM | POA: Insufficient documentation

## 2021-11-30 LAB — CBC
HCT: 37.4 % (ref 36.0–46.0)
Hemoglobin: 11.9 g/dL — ABNORMAL LOW (ref 12.0–15.0)
MCH: 27.2 pg (ref 26.0–34.0)
MCHC: 31.8 g/dL (ref 30.0–36.0)
MCV: 85.6 fL (ref 80.0–100.0)
Platelets: 292 10*3/uL (ref 150–400)
RBC: 4.37 MIL/uL (ref 3.87–5.11)
RDW: 12.2 % (ref 11.5–15.5)
WBC: 6.2 10*3/uL (ref 4.0–10.5)
nRBC: 0 % (ref 0.0–0.2)

## 2021-11-30 LAB — CBG MONITORING, ED
Glucose-Capillary: 84 mg/dL (ref 70–99)
Glucose-Capillary: 81 mg/dL (ref 70–99)
Glucose-Capillary: 85 mg/dL (ref 70–99)
Glucose-Capillary: 76 mg/dL (ref 70–99)
Glucose-Capillary: 75 mg/dL (ref 70–99)
Glucose-Capillary: 116 mg/dL — ABNORMAL HIGH (ref 70–99)

## 2021-11-30 LAB — RESP PANEL BY RT-PCR (FLU A&B, COVID) ARPGX2
Influenza A by PCR: NEGATIVE
Influenza B by PCR: NEGATIVE
SARS Coronavirus 2 by RT PCR: NEGATIVE

## 2021-11-30 LAB — BASIC METABOLIC PANEL
Anion gap: 8 (ref 5–15)
BUN: 16 mg/dL (ref 6–20)
CO2: 23 mmol/L (ref 22–32)
Calcium: 9.2 mg/dL (ref 8.9–10.3)
Chloride: 107 mmol/L (ref 98–111)
Creatinine, Ser: 0.86 mg/dL (ref 0.44–1.00)
GFR, Estimated: >60 mL/min (ref >60)
Glucose, Bld: 99 mg/dL (ref 70–99)
Potassium: 3.4 mmol/L — ABNORMAL LOW (ref 3.5–5.1)
Sodium: 138 mmol/L (ref 135–145)

## 2021-11-30 LAB — URINALYSIS, ROUTINE W REFLEX MICROSCOPIC
Bilirubin Urine: NEGATIVE
Glucose, UA: NEGATIVE mg/dL
Hgb urine dipstick: NEGATIVE
Ketones, ur: NEGATIVE mg/dL
Leukocytes,Ua: NEGATIVE
Nitrite: NEGATIVE
Protein, ur: NEGATIVE mg/dL
Specific Gravity, Urine: 1.012 (ref 1.005–1.030)
pH: 7 (ref 5.0–8.0)

## 2021-11-30 LAB — GLUCOSE, CAPILLARY: Glucose-Capillary: 131 mg/dL — ABNORMAL HIGH (ref 70–99)

## 2021-11-30 MED ORDER — ENOXAPARIN SODIUM 60 MG/0.6ML IJ SOSY
55.0000 mg | PREFILLED_SYRINGE | Freq: Every day | INTRAMUSCULAR | Status: DC
  Administered 2021-11-30: 55 mg via SUBCUTANEOUS
  Filled 2021-11-30: qty 0.6

## 2021-11-30 MED ORDER — RISAQUAD PO CAPS: 1.0000 | ORAL_CAPSULE | Freq: Every day | ORAL | Status: DC

## 2021-11-30 MED ORDER — INSULIN ASPART 100 UNIT/ML IJ SOLN: 0.0000 [IU] | Freq: Three times a day (TID) | INTRAMUSCULAR | Status: DC

## 2021-11-30 MED ORDER — ACETAMINOPHEN 650 MG RE SUPP: 650.0000 mg | Freq: Four times a day (QID) | RECTAL | Status: DC | PRN

## 2021-11-30 MED ORDER — ACETAMINOPHEN 325 MG PO TABS: 650.0000 mg | ORAL_TABLET | Freq: Four times a day (QID) | ORAL | Status: DC | PRN

## 2021-11-30 MED ORDER — LORAZEPAM 2 MG/ML IJ SOLN: 1.0000 mg | INTRAMUSCULAR | Status: DC | PRN

## 2021-11-30 MED ORDER — INSULIN ASPART 100 UNIT/ML IJ SOLN: 0.0000 [IU] | Freq: Every day | INTRAMUSCULAR | Status: DC

## 2021-11-30 MED ORDER — FENOFIBRATE 160 MG PO TABS
160.0000 mg | ORAL_TABLET | Freq: Every day | ORAL | Status: DC
  Filled 2021-11-30: qty 1

## 2021-11-30 MED ORDER — VITAMIN B-12 1000 MCG PO TABS: 3000.0000 ug | ORAL_TABLET | Freq: Every day | ORAL | Status: DC

## 2021-11-30 MED ORDER — ONDANSETRON HCL 4 MG PO TABS: 4.0000 mg | ORAL_TABLET | Freq: Four times a day (QID) | ORAL | Status: DC | PRN

## 2021-11-30 MED ORDER — ALBUTEROL SULFATE (2.5 MG/3ML) 0.083% IN NEBU: 3.0000 mL | INHALATION_SOLUTION | RESPIRATORY_TRACT | Status: DC | PRN

## 2021-11-30 MED ORDER — TOPIRAMATE 100 MG PO TABS
100.0000 mg | ORAL_TABLET | Freq: Every day | ORAL | Status: DC
  Administered 2021-11-30: 100 mg via ORAL
  Filled 2021-11-30: qty 1
  Filled 2021-11-30: qty 4

## 2021-11-30 MED ORDER — HYDRALAZINE HCL 10 MG PO TABS
10.0000 mg | ORAL_TABLET | Freq: Four times a day (QID) | ORAL | Status: DC | PRN
  Filled 2021-11-30: qty 1

## 2021-11-30 MED ORDER — DEXTROSE IN LACTATED RINGERS 5 % IV SOLN: INTRAVENOUS | Status: DC

## 2021-11-30 MED ORDER — TRAZODONE HCL 100 MG PO TABS: 100.0000 mg | ORAL_TABLET | Freq: Every evening | ORAL | Status: DC | PRN

## 2021-11-30 MED ORDER — LOSARTAN POTASSIUM 50 MG PO TABS
50.0000 mg | ORAL_TABLET | Freq: Every day | ORAL | Status: DC
  Administered 2021-12-01: 50 mg via ORAL
  Filled 2021-11-30: qty 1

## 2021-11-30 MED ORDER — PANTOPRAZOLE SODIUM 40 MG PO TBEC: 40.0000 mg | DELAYED_RELEASE_TABLET | Freq: Every day | ORAL | Status: DC | PRN

## 2021-11-30 MED ORDER — VITAMIN D 25 MCG (1000 UNIT) PO TABS
4000.0000 [IU] | ORAL_TABLET | Freq: Every day | ORAL | Status: DC
  Administered 2021-11-30: 4000 [IU] via ORAL
  Filled 2021-11-30: qty 4

## 2021-11-30 MED ORDER — MELATONIN 5 MG PO TABS
5.0000 mg | ORAL_TABLET | Freq: Every day | ORAL | Status: DC
  Filled 2021-11-30: qty 1

## 2021-11-30 MED ORDER — DEXTROSE 50 % IV SOLN
25.0000 mL | Freq: Once | INTRAVENOUS | Status: AC
  Administered 2021-11-30: 25 mL via INTRAVENOUS
  Filled 2021-11-30: qty 50

## 2021-11-30 MED ORDER — PROBIOTIC-10 PO CHEW: 1.0000 | CHEWABLE_TABLET | Freq: Every day | ORAL | Status: DC

## 2021-11-30 MED ORDER — POLYETHYLENE GLYCOL 3350 17 G PO PACK: 17.0000 g | PACK | Freq: Every day | ORAL | Status: DC | PRN

## 2021-11-30 MED ORDER — DOCUSATE SODIUM 100 MG PO CAPS
100.0000 mg | ORAL_CAPSULE | Freq: Every day | ORAL | Status: DC
  Administered 2021-11-30: 100 mg via ORAL
  Filled 2021-11-30: qty 1

## 2021-11-30 MED ORDER — ONDANSETRON HCL 4 MG/2ML IJ SOLN: 4.0000 mg | Freq: Four times a day (QID) | INTRAMUSCULAR | Status: DC | PRN

## 2021-11-30 MED ORDER — VENLAFAXINE HCL ER 75 MG PO CP24
225.0000 mg | ORAL_CAPSULE | Freq: Every day | ORAL | Status: DC
  Administered 2021-11-30: 225 mg via ORAL
  Filled 2021-11-30 (×2): qty 1

## 2021-11-30 MED ORDER — DEXTROSE 50 % IV SOLN: 1.0000 | INTRAVENOUS | Status: DC | PRN

## 2021-11-30 NOTE — Assessment & Plan Note (Signed)
-   Resumed venlafaxine to 25 mg nightly ?- Lorazepam 1 mg IV every 4 hours as needed for anxiety ?

## 2021-11-30 NOTE — Assessment & Plan Note (Signed)
-   Etiology work-up in progress, presumed secondary to symptomatic hypoglycemia ?- Treat as per hypoglycemia below ?- Fall precautions ? ?

## 2021-11-30 NOTE — Assessment & Plan Note (Signed)
-   Patient takes losartan 50 mg nightly ?- This has been resumed for 12/01/2021 as patient's blood pressure is low normotensive at this time ?- Hydralazine 10 mg PO every 6 hours as needed for SBP greater than 160, 4 doses ordered ?

## 2021-11-30 NOTE — ED Triage Notes (Signed)
Pt comes into the ED via POV c/o syncopal episode today while working.  Pt works for an [employer] so they continued to monitor her CBG at work as well as her BP.  Pt states that anytime her CBG get in the 70's she starts feeling this way.  Pt presents with a list of all her BP and cBG's over the past hour.  PT in NAD at this time with even and unlabored respirations.  ?

## 2021-11-30 NOTE — ED Provider Notes (Signed)
? ?[facility] ?Provider Note ? ? Event Date/Time  ? First MD Initiated Contact with Patient 11/30/21 1516   ?  (approximate) ?History  ?Loss of Consciousness and Hypoglycemia ? ?HPI ?Julie Estrada is a 31 y.o. female with a past medical history of obesity, hypertriglyceridemia, prediabetes, generalized anxiety disorder, PTSD, migraines, and idiopathic postprandial hypoglycemia who presents for hypoglycemia and an episode of syncope that occurred earlier today.  Patient states that she was feeling lightheaded with an associated headache and found her blood sugar to be in the 80s.  Patient tried p.o. intake including glucose tablets that improved her blood sugar to the 150s/160s with improvement of all her symptoms.  Patient states that before the p.o. raised her blood sugar she did have an episode of standing up and feeling lightheaded before losing consciousness for short period of time.  Patient states that she was somewhat awake and she continued to take glucose tablets given the persistent hypoglycemia.  Patient was then told by her primary care doctor to try and eat a large meal in order to keep her blood glucose elevated however shortly after this meal patient's blood sugar dropped again to the 80s with recurrence of headache and lightheadedness.  Patient states that she still has a headache but no longer feels lightheaded at this time.  Patient states that she has not had symptoms similar to this in over a year.  Patient states that she has not been metformin for some time but has been on Ozempic for almost a year. ?Physical Exam  ?Triage Vital Signs: ?ED Triage Vitals  ?Enc Vitals Group  ?   BP 11/30/21 1512 125/77  ?   Pulse Rate 11/30/21 1512 82  ?   Resp 11/30/21 1512 20  ?   Temp 11/30/21 1512 98.3 ?F (36.8 ?C)  ?   Temp Source 11/30/21 1512 Oral  ?   SpO2 11/30/21 1512 99 %  ?   Weight 11/30/21 1510 244 lb 14.9 oz (111.1 kg)  ?   Height 11/30/21 1510 '5\' 3"'$  (1.6 m)  ?   Head  Circumference --   ?   Peak Flow --   ?   Pain Score 11/30/21 1510 5  ?   Pain Loc --   ?   Pain Edu? --   ?   Excl. in [place]? --   ? ?Most recent vital signs: ?Vitals:  ? 11/30/21 1512  ?BP: 125/77  ?Pulse: 82  ?Resp: 20  ?Temp: 98.3 ?F (36.8 ?C)  ?SpO2: 99%  ? ?General: Awake, oriented x4. ?CV:  Good peripheral perfusion.  ?Resp:  Normal effort.  ?Abd:  No distention.  ?Other:  Young adult obese Caucasian female laying in bed in no distress ?ED Results / Procedures / Treatments  ?Labs ?(all labs ordered are listed, but only abnormal results are displayed) ?Labs Reviewed  ?CBC - Abnormal; Notable for the following components:  ?    Result Value  ? Hemoglobin 11.9 (*)   ? All other components within normal limits  ?BASIC METABOLIC PANEL  ?URINALYSIS, ROUTINE W REFLEX MICROSCOPIC  ?C-PEPTIDE  ?CBG MONITORING, ED  ?POC URINE PREG, ED  ? ?EKG ?ED ECG REPORT ?I, Naaman Plummer, the attending physician, personally viewed and interpreted this ECG. ?Date: 11/30/2021 ?EKG Time: 1512 ?Rate: 78 ?Rhythm: normal sinus rhythm ?QRS Axis: normal ?Intervals: normal ?ST/T Wave abnormalities: normal ?Narrative Interpretation: no evidence of acute ischemia ?PROCEDURES: ?Critical Care performed: No ?.1-3 Lead EKG Interpretation ?Performed by:  Naaman Plummer, MD ?Authorized by: Naaman Plummer, MD  ? ?  Interpretation: normal   ?  ECG rate:  71 ?  ECG rate assessment: normal   ?  Rhythm: sinus rhythm   ?  Ectopy: none   ?  Conduction: normal   ?CRITICAL CARE ?Performed by: Naaman Plummer ? ?Total critical care time: 19 minutes ? ?Critical care time was exclusive of separately billable procedures and treating other patients. ? ?Critical care was necessary to treat or prevent imminent or life-threatening deterioration. ? ?Critical care was time spent personally by me on the following activities: development of treatment plan with patient and/or surrogate as well as nursing, discussions with consultants, evaluation of patient's response to  treatment, examination of patient, obtaining history from patient or surrogate, ordering and performing treatments and interventions, ordering and review of laboratory studies, ordering and review of radiographic studies, pulse oximetry and re-evaluation of patient's condition. ? ?MEDICATIONS ORDERED IN ED: ?Medications - No data to display ?IMPRESSION / MDM / ASSESSMENT AND PLAN / ED COURSE  ?I reviewed the triage vital signs and the nursing notes. ?             ?               ?Differential diagnosis includes, but is not limited to, idiopathic postprandial hypoglycemia, exogenous insulin use, medication side effect, ?The patient is on the cardiac monitor to evaluate for evidence of arrhythmia and/or significant heart rate changes. ?Patient is a 31 year old female with stated past medical history of prediabetes on Ozempic who presents for altered mental status and syncope in the setting of hypoglycemia.  [name] found to have glucose of 99 on metabolic panel that continues to decrease to 80 and then to the 70s despite administration of 1/2 ampoule of D50 as well as p.o. intake.  Given this persistent hypoglycemia, patient will benefit from a D5 infusion and admission to the internal medicine service for further evaluation and management. ?Consult: ?Hospitalist-Dr. Cox agrees with plan for admission ? ?Dispo: Admit to medicine ? ?  ?FINAL CLINICAL IMPRESSION(S) / ED DIAGNOSES  ? ?Final diagnoses:  ?None  ? ?Rx / DC Orders  ? ?ED Discharge Orders   ? ? None  ? ?  ? ?Note:  This document was prepared using Dragon voice recognition software and may include unintentional dictation errors. ?  ?Naaman Plummer, MD ?11/30/21 2142 ? ?

## 2021-11-30 NOTE — Assessment & Plan Note (Addendum)
-   Symptomatic ?- Etiology work-up in progress at this time, differentials include exogenous surreptitious use/ingestion of antihyperglycemic agents, idiopathic hypoglycemia ?- ED has ordered C-peptide and is in progress ?- Check sulfonylurea panel, proinsulin/insulin ratio ?- Supportive care with D5 LR, at 100 mL/h, 10 hours ordered ?- D50, 1 amp, as needed hypoglycemia, 2 days ordered ?

## 2021-11-30 NOTE — Assessment & Plan Note (Signed)
-   Patient takes semaglutide subcutaneous injection weekly, every Monday ?- This has not been resumed at this time ?- Insulin SSI with agents coverage ordered ?
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
## 2021-11-30 NOTE — Hospital Course (Addendum)
Ms. Julie Estrada is 31 year old female with history of hypertension, generalized anxiety disorder, GERD, history of intractable migraines, non-insulin-dependent diabetes mellitus, PTSD, obesity, who presents emergency department from work for chief concerns of presyncope, hypoglycemia. ? ?Initial vitals in the emergency department showed temperature 98.3, respiration rate of 15, heart rate 75, blood pressure 112/70, SPO2 100% on room air. ? ?Serum sodium is 138, potassium 3.4, chloride 107, bicarb 23, BUN 16, serum creatinine is 0.86, nonfasting blood glucose 99, GFR of 60, WBC 6.5, hemoglobin 11.9, platelets of 292. ? ?UA was negative for nitrates and leukocytes. ? ?ED treatment: Patient was given D50 one-time dose. ?

## 2021-11-30 NOTE — Assessment & Plan Note (Signed)
-   Protonix 40 mg daily as needed for acid reflux/indigestion, 2 doses ordered ?

## 2021-11-30 NOTE — ED Notes (Signed)
Pt has had hysterectomy. DC UP.  ?

## 2021-12-01 DIAGNOSIS — E162 Hypoglycemia, unspecified: Secondary | ICD-10-CM

## 2021-12-01 DIAGNOSIS — E119 Type 2 diabetes mellitus without complications: Secondary | ICD-10-CM

## 2021-12-01 LAB — BASIC METABOLIC PANEL
Anion gap: 7 (ref 5–15)
BUN: 16 mg/dL (ref 6–20)
CO2: 24 mmol/L (ref 22–32)
Calcium: 9 mg/dL (ref 8.9–10.3)
Chloride: 110 mmol/L (ref 98–111)
Creatinine, Ser: 0.78 mg/dL (ref 0.44–1.00)
GFR, Estimated: >60 mL/min (ref >60)
Glucose, Bld: 92 mg/dL (ref 70–99)
Potassium: 3.5 mmol/L (ref 3.5–5.1)
Sodium: 141 mmol/L (ref 135–145)

## 2021-12-01 LAB — GLUCOSE, CAPILLARY
Glucose-Capillary: 87 mg/dL (ref 70–99)
Glucose-Capillary: 96 mg/dL (ref 70–99)
Glucose-Capillary: 76 mg/dL (ref 70–99)

## 2021-12-01 LAB — HIV ANTIBODY (ROUTINE TESTING W REFLEX): HIV Screen 4th Generation wRfx: NONREACTIVE

## 2021-12-01 LAB — MAGNESIUM: Magnesium: 2.2 mg/dL (ref 1.7–2.4)

## 2021-12-01 LAB — TSH: TSH: 1.346 u[IU]/mL (ref 0.350–4.500)

## 2021-12-01 NOTE — Assessment & Plan Note (Signed)
Probably due to semaglutide injection, which does not usually cause hypoglycemia.  Patient had 40 pounds of weight loss may contribute to this.  Patient is advised to hold off injection until seen by her family doctor.  She is also advised to prepare some candy in the pocket, and to use as needed.  Today condition has improved, she is medically stable to be discharged ?

## 2021-12-01 NOTE — Discharge Summary (Addendum)
Physician Discharge Summary   Patient: Julie Estrada MRN: [id number] DOB: 09-Dec-1990  Admit date:     11/30/2021  Discharge date: 12/01/21  Discharge Physician: Sharen Hones   PCP: Latanya Maudlin, NP   Recommendations at discharge:   Follow-up with PCP in 1 week Hold off semaglutide until seen by PCP 3.  Keep some candies in the pocket, use as needed. 4.  Eat a small meal before sleep to avoid nighttime hypoglycemia. 5.  Follow-up with PCP and look at C-peptide level when available.  Lab is sent to [facility], no result yet.  Discharge Diagnoses: Principal Problem:   Near syncope Active Problems:   GAD (generalized anxiety disorder)   Obesity, Class II, BMI 35-39.9   S/P hysterectomy   Diabetes mellitus type 2, noninsulin dependent (HCC)   Essential hypertension   GERD (gastroesophageal reflux disease)   Hypoglycemia  Resolved Problems:   * No resolved hospital problems. [employer] Course: Ms. Julie Estrada is 31 year old female with history of hypertension, generalized anxiety disorder, GERD, history of intractable migraines, non-insulin-dependent diabetes mellitus, PTSD, obesity, who presents emergency department from work for chief concerns of presyncope, hypoglycemia. Patient is taking Semaglutide, last dose was on Monday. No recent changes of medications, no recent sickness. She was placed on D5 insulin for a short course. Glucose has improved.   Assessment and Plan: * Near syncope Secondary to hypoglycemia.  Patient no longer feels dizzy.  Hypoglycemia Probably due to semaglutide injection, which does not usually cause hypoglycemia.  Patient had 40 pounds of weight loss may contribute to this.  Patient is advised to hold off injection until seen by her family doctor.  She is also advised to prepare some candy in the pocket, and to use as needed.  Today condition has improved, she is medically stable to be discharged  GERD (gastroesophageal reflux  disease) Continue home treatment  Essential hypertension Resume home regimen.  Diabetes mellitus type 2, noninsulin dependent (HCC) Glucose appears to be well controlled.  Obesity, Class II, BMI 35-39.9 Patient has lost 40 pounds of weight during the past year.         Consultants: None Procedures performed: None  Disposition: Home Diet recommendation:  Discharge Diet Orders (From admission, onward)     Start     Ordered   12/01/21 0000  Diet - low sodium heart healthy        12/01/21 0949           Carb modified diet DISCHARGE MEDICATION: Allergies as of 12/01/2021       Reactions   Iodine Anaphylaxis   Shrimp [shellfish Allergy] Anaphylaxis   Cephalosporins Nausea And Vomiting   Doxycycline Nausea And Vomiting   Penicillins Nausea And Vomiting   Has patient had a PCN reaction causing immediate rash, facial/tongue/throat swelling, SOB or lightheadedness with hypotension: Unknown Has patient had a PCN reaction causing severe rash involving mucus membranes or skin necrosis: No Has patient had a PCN reaction that required hospitalization: No Has patient had a PCN reaction occurring within the last 10 years: Yes If all of the above answers are "NO", then may proceed with Cephalosporin use.   Morphine And Related Anxiety        Medication List     STOP taking these medications    Semaglutide (1 MG/DOSE) 2 MG/1.5ML Sopn       TAKE these medications    acetaminophen 325 MG tablet Commonly known as: TYLENOL Take 650 mg by  mouth every 6 (six) hours as needed for mild pain or moderate pain.   albuterol 108 (90 Base) MCG/ACT inhaler Commonly known as: VENTOLIN HFA Inhale 2 puffs into the lungs every 4 (four) hours as needed for shortness of breath or wheezing.   docusate sodium 100 MG capsule Commonly known as: COLACE Take 100 mg by mouth at bedtime.   fenofibrate micronized 134 MG capsule Commonly known as: LOFIBRA Take 134 mg by mouth at  bedtime.   losartan 50 MG tablet Commonly known as: COZAAR Take 50 mg by mouth at bedtime.   Probiotic-10 Chew Chew 1 tablet by mouth at bedtime.   topiramate 100 MG tablet Commonly known as: TOPAMAX Take 100 mg by mouth at bedtime.   venlafaxine XR 75 MG 24 hr capsule Commonly known as: EFFEXOR-XR Take 225 mg by mouth at bedtime.   vitamin B-12 1000 MCG tablet Commonly known as: CYANOCOBALAMIN Take 3,000 mcg by mouth at bedtime.   Vitamin D3 50 MCG (2000 UT) Tabs Take 4,000 Units by mouth at bedtime.        Follow-up Information     Latanya Maudlin, NP Follow up in 1 week(s).   Specialty: Family Medicine Contact information: [place] [address] [phone]                Discharge Exam: Danley Danker Weights   11/30/21 1510 11/30/21 2227  Weight: 111.1 kg 101.7 kg   General exam: Appears calm and comfortable  Respiratory system: Clear to auscultation. Respiratory effort normal. Cardiovascular system: S1 & S2 heard, RRR. No JVD, murmurs, rubs, gallops or clicks. No pedal edema. Gastrointestinal system: Abdomen is nondistended, soft and nontender. No organomegaly or masses felt. Normal bowel sounds heard. Central nervous system: Alert and oriented. No focal neurological deficits. Extremities: Symmetric 5 x 5 power. Skin: No rashes, lesions or ulcers Psychiatry: Judgement and insight appear normal. Mood & affect appropriate.    Condition at discharge: good  The results of significant diagnostics from this hospitalization (including imaging, microbiology, ancillary and laboratory) are listed below for reference.   Imaging Studies: No results found.  Microbiology: Results for orders placed or performed during the hospital encounter of 11/30/21  Resp Panel by RT-PCR (Flu A&B, Covid) Nasopharyngeal Swab     Status: None   Collection Time: 11/30/21  9:33 PM   Specimen: Nasopharyngeal Swab; Nasopharyngeal(NP) swabs in vial transport medium   Result Value Ref Range Status   SARS Coronavirus 2 by RT PCR NEGATIVE NEGATIVE Final    Comment: (NOTE) SARS-CoV-2 target nucleic acids are NOT DETECTED.  The SARS-CoV-2 RNA is generally detectable in upper respiratory specimens during the acute phase of infection. The lowest concentration of SARS-CoV-2 viral copies this assay can detect is 138 copies/mL. A negative result does not preclude SARS-Cov-2 infection and should not be used as the sole basis for treatment or other patient management decisions. A negative result may occur with  improper specimen collection/handling, submission of specimen other than nasopharyngeal swab, presence of viral mutation(s) within the areas targeted by this assay, and inadequate number of viral copies(<138 copies/mL). A negative result must be combined with clinical observations, patient history, and epidemiological information. The expected result is Negative.  Fact Sheet for Patients:  EntrepreneurPulse.com.au  Fact Sheet for Healthcare Providers:  IncredibleEmployment.be  This test is no t yet approved or cleared by the [place] FDA and  has been authorized for detection and/or diagnosis of SARS-CoV-2 by FDA under an Emergency Use  Authorization (EUA). This EUA will remain  in effect (meaning this test can be used) for the duration of the COVID-19 declaration under Section 564(b)(1) of the Act, 21 U.S.C.section 360bbb-3(b)(1), unless the authorization is terminated  or revoked sooner.       Influenza A by PCR NEGATIVE NEGATIVE Final   Influenza B by PCR NEGATIVE NEGATIVE Final    Comment: (NOTE) The Xpert Xpress SARS-CoV-2/FLU/RSV plus assay is intended as an aid in the diagnosis of influenza from Nasopharyngeal swab specimens and should not be used as a sole basis for treatment. Nasal washings and aspirates are unacceptable for Xpert Xpress SARS-CoV-2/FLU/RSV testing.  Fact Sheet for  Patients: EntrepreneurPulse.com.au  Fact Sheet for Healthcare Providers: IncredibleEmployment.be  This test is not yet approved or cleared by the [place] FDA and has been authorized for detection and/or diagnosis of SARS-CoV-2 by FDA under an Emergency Use Authorization (EUA). This EUA will remain in effect (meaning this test can be used) for the duration of the COVID-19 declaration under Section 564(b)(1) of the Act, 21 U.S.C. section 360bbb-3(b)(1), unless the authorization is terminated or revoked.  Performed at [facility], [address] [place], [place] [address]     Labs: CBC: Recent Labs  Lab 11/30/21 1545  WBC 6.2  HGB 11.9*  HCT 37.4  MCV 85.6  PLT 939   Basic Metabolic Panel: Recent Labs  Lab 11/30/21 1545 11/30/21 2314 12/01/21 0435  NA 138  --  141  K 3.4*  --  3.5  CL 107  --  110  CO2 23  --  24  GLUCOSE 99  --  92  BUN 16  --  16  CREATININE 0.86  --  0.78  CALCIUM 9.2  --  9.0  MG  --  2.2  --    Liver Function Tests: No results for input(s): AST, ALT, ALKPHOS, BILITOT, PROT, ALBUMIN in the last 168 hours. CBG: Recent Labs  Lab 11/30/21 2011 11/30/21 2131 11/30/21 2225 12/01/21 0746 12/01/21 0936  GLUCAP 75 116* 131* 87 96    Discharge time spent: less than 30 minutes.  Signed: Sharen Hones, MD [facility] 12/01/2021

## 2021-12-01 NOTE — Assessment & Plan Note (Signed)
Patient has lost 40 pounds of weight during the past year. ?

## 2021-12-01 NOTE — Assessment & Plan Note (Signed)
Glucose appears to be well controlled. ?

## 2021-12-01 NOTE — Plan of Care (Signed)
Patient discharged home per MD orders at this time.All discharge instructions,education and medications reviewed with the patient.Pt expressed understanding and will comply with dc instructions.follow up appointments was also communicated to the Pt.no verbal or any ssx of distress.Pt was discharged home with self care per order.Home medications sent to Pharmacy was returned to Pt before leaving the unit.Pt was transported home by significant order in a privately owned vehicle. ?

## 2021-12-01 NOTE — Assessment & Plan Note (Signed)
Resume home regimen. 

## 2021-12-01 NOTE — Assessment & Plan Note (Signed)
Secondary to hypoglycemia.  Patient no longer feels dizzy. ?

## 2021-12-01 NOTE — Hospital Course (Addendum)
Ms. Julie Estrada is 31 year old female with history of hypertension, generalized anxiety disorder, GERD, history of intractable migraines, non-insulin-dependent diabetes mellitus, PTSD, obesity, who presents emergency department from work for chief concerns of presyncope, hypoglycemia. ?Patient is taking Semaglutide, last dose was on Monday. No recent changes of medications, no recent sickness. ?She was placed on D5 insulin for a short course. Glucose has improved.  ?

## 2021-12-01 NOTE — Assessment & Plan Note (Signed)
Continue home treatment 

## 2021-12-03 LAB — C-PEPTIDE: C-Peptide: 9.6 ng/mL — ABNORMAL HIGH (ref 1.1–4.4)

## 2021-12-03 LAB — HEMOGLOBIN A1C
Hgb A1c MFr Bld: 5.2 % (ref 4.8–5.6)
Mean Plasma Glucose: 103 mg/dL

## 2021-12-06 ENCOUNTER — Other Ambulatory Visit: Payer: Self-pay | Admitting: Gerontology

## 2021-12-06 DIAGNOSIS — E88819 Insulin resistance, unspecified: Secondary | ICD-10-CM

## 2021-12-06 DIAGNOSIS — E161 Other hypoglycemia: Secondary | ICD-10-CM

## 2021-12-06 DIAGNOSIS — R947 Abnormal results of other endocrine function studies: Secondary | ICD-10-CM

## 2021-12-06 DIAGNOSIS — Z09 Encounter for follow-up examination after completed treatment for conditions other than malignant neoplasm: Secondary | ICD-10-CM

## 2021-12-06 DIAGNOSIS — R7303 Prediabetes: Secondary | ICD-10-CM

## 2021-12-06 DIAGNOSIS — R55 Syncope and collapse: Secondary | ICD-10-CM

## 2021-12-14 LAB — SULFONYLUREA HYPOGLYCEMICS PANEL, SERUM
Acetohexamide: NEGATIVE ug/mL (ref 20–60)
Chlorpropamide: NEGATIVE ug/mL (ref 75–250)
Glimepiride: NEGATIVE ng/mL (ref 80–250)
Glipizide: NEGATIVE ng/mL (ref 200–1000)
Glyburide: NEGATIVE ng/mL
Nateglinide: NEGATIVE ng/mL
Repaglinide: NEGATIVE ng/mL
Tolazamide: NEGATIVE ug/mL
Tolbutamide: NEGATIVE ug/mL (ref 40–100)

## 2021-12-18 ENCOUNTER — Other Ambulatory Visit: Payer: Self-pay

## 2021-12-18 ENCOUNTER — Ambulatory Visit
Admission: RE | Admit: 2021-12-18 | Discharge: 2021-12-18 | Disposition: A | Discharge status: Home / Self Care | Payer: BC Managed Care – PPO | Source: Ambulatory Visit | Attending: Gerontology | Admitting: Gerontology

## 2021-12-18 DIAGNOSIS — E161 Other hypoglycemia: Secondary | ICD-10-CM

## 2021-12-18 DIAGNOSIS — R7303 Prediabetes: Secondary | ICD-10-CM

## 2021-12-18 DIAGNOSIS — R55 Syncope and collapse: Secondary | ICD-10-CM

## 2021-12-18 DIAGNOSIS — R947 Abnormal results of other endocrine function studies: Secondary | ICD-10-CM

## 2021-12-18 DIAGNOSIS — E88819 Insulin resistance, unspecified: Secondary | ICD-10-CM

## 2021-12-18 DIAGNOSIS — E8881 Metabolic syndrome: Secondary | ICD-10-CM

## 2021-12-18 DIAGNOSIS — Z09 Encounter for follow-up examination after completed treatment for conditions other than malignant neoplasm: Secondary | ICD-10-CM

## 2021-12-18 MED ORDER — GADOBENATE DIMEGLUMINE 529 MG/ML IV SOLN
20.0000 mL | Freq: Once | INTRAVENOUS | Status: AC | PRN
  Administered 2021-12-18: 20 mL via INTRAVENOUS

## 2022-01-18 ENCOUNTER — Other Ambulatory Visit: Payer: Self-pay | Admitting: Neurology

## 2022-01-18 DIAGNOSIS — G43519 Persistent migraine aura without cerebral infarction, intractable, without status migrainosus: Secondary | ICD-10-CM

## 2022-02-21 ENCOUNTER — Ambulatory Visit
Admission: RE | Admit: 2022-02-21 | Discharge: 2022-02-21 | Disposition: A | Discharge status: Home / Self Care | Payer: BC Managed Care – PPO | Source: Ambulatory Visit | Attending: Neurology | Admitting: Neurology

## 2022-02-21 DIAGNOSIS — G43519 Persistent migraine aura without cerebral infarction, intractable, without status migrainosus: Secondary | ICD-10-CM

## 2022-02-21 MED ORDER — GADOBENATE DIMEGLUMINE 529 MG/ML IV SOLN
20.0000 mL | Freq: Once | INTRAVENOUS | Status: AC | PRN
  Administered 2022-02-21: 20 mL via INTRAVENOUS

## 2022-04-01 ENCOUNTER — Other Ambulatory Visit: Payer: Self-pay | Admitting: Gerontology

## 2022-04-01 DIAGNOSIS — R1031 Right lower quadrant pain: Secondary | ICD-10-CM

## 2022-04-01 DIAGNOSIS — R102 Pelvic and perineal pain: Secondary | ICD-10-CM

## 2022-04-16 ENCOUNTER — Ambulatory Visit
Admission: RE | Admit: 2022-04-16 | Discharge: 2022-04-16 | Disposition: A | Discharge status: Home / Self Care | Payer: BC Managed Care – PPO | Source: Ambulatory Visit | Attending: Gerontology | Admitting: Gerontology

## 2022-04-16 DIAGNOSIS — R1032 Left lower quadrant pain: Secondary | ICD-10-CM

## 2022-04-16 DIAGNOSIS — R102 Pelvic and perineal pain: Secondary | ICD-10-CM

## 2022-04-19 ENCOUNTER — Other Ambulatory Visit: Payer: Self-pay | Admitting: Gerontology

## 2022-04-19 DIAGNOSIS — R229 Localized swelling, mass and lump, unspecified: Secondary | ICD-10-CM

## 2022-04-26 ENCOUNTER — Other Ambulatory Visit: Payer: Self-pay | Admitting: Student

## 2022-04-26 ENCOUNTER — Other Ambulatory Visit: Payer: Self-pay | Admitting: Internal Medicine

## 2022-04-26 DIAGNOSIS — G932 Benign intracranial hypertension: Secondary | ICD-10-CM

## 2022-05-07 ENCOUNTER — Ambulatory Visit
Admission: RE | Admit: 2022-05-07 | Discharge: 2022-05-07 | Disposition: A | Discharge status: Home / Self Care | Payer: BC Managed Care – PPO | Source: Ambulatory Visit | Attending: Gerontology | Admitting: Gerontology

## 2022-05-07 DIAGNOSIS — R229 Localized swelling, mass and lump, unspecified: Secondary | ICD-10-CM

## 2022-05-17 ENCOUNTER — Ambulatory Visit
Admission: RE | Admit: 2022-05-17 | Discharge: 2022-05-17 | Disposition: A | Discharge status: Home / Self Care | Payer: BC Managed Care – PPO | Source: Ambulatory Visit | Attending: Student | Admitting: Student

## 2022-05-17 DIAGNOSIS — G932 Benign intracranial hypertension: Secondary | ICD-10-CM | POA: Insufficient documentation

## 2022-05-17 LAB — CSF CELL COUNT WITH DIFFERENTIAL
Eosinophils, CSF: 0 %
Lymphs, CSF: 67 %
Monocyte-Macrophage-Spinal Fluid: 15 %
RBC Count, CSF: 208 /mm3 — ABNORMAL HIGH (ref 0–3)
Segmented Neutrophils-CSF: 18 %
Tube #: 2
WBC, CSF: 7 /mm3 — ABNORMAL HIGH (ref 0–5)

## 2022-05-17 LAB — GLUCOSE, CSF: Glucose, CSF: 65 mg/dL (ref 40–70)

## 2022-05-17 LAB — PROTEIN, CSF: Total  Protein, CSF: 27 mg/dL (ref 15–45)

## 2022-05-17 MED ORDER — ACETAMINOPHEN 500 MG PO TABS
1000.0000 mg | ORAL_TABLET | Freq: Once | ORAL | Status: AC
  Administered 2022-05-17: 1000 mg via ORAL
  Filled 2022-05-17: qty 2

## 2022-05-17 MED ORDER — ACETAMINOPHEN 500 MG PO TABS
ORAL_TABLET | ORAL | Status: AC
  Filled 2022-05-17: qty 2

## 2022-05-17 NOTE — Progress Notes (Signed)
Pt. Med. With tylenol 1000 mg p.o. for c/o back pain "5" on scale 1-10.

## 2022-05-17 NOTE — Discharge Instructions (Signed)
Lumbar Puncture, Care After Refer to this sheet in the next few weeks. These instructions provide you with information on caring for yourself after your procedure. Your health care provider may also give you more specific instructions. Your treatment has been planned according to current medical practices, but problems sometimes occur. Call your health care provider if you have any problems or questions after your procedure. What can I expect after the procedure? After your procedure, it is typical to have the following sensations: Mild discomfort or pain at the insertion site. Mild headache that is relieved with pain medicines.  Follow these instructions at home:  Avoid lifting anything heavier than 10 lb (4.5 kg) for at least 12 hours after the procedure. Drink enough fluids to keep your urine clear or pale yellow. Lay flat or as flat as possible for the remainder of the day. Contact a health care provider if: You have fever or chills. You have nausea or vomiting. You have a headache that lasts for more than 2 days. Get help right away if: You have any numbness or tingling in your legs. You are unable to control your bowel or bladder. You have bleeding or swelling in your back at the insertion site. You are dizzy or faint. This information is not intended to replace advice given to you by your health care provider. Make sure you discuss any questions you have with your health care provider. Document Released: 09/14/2013 Document Revised: 02/15/2016 Document Reviewed: 05/18/2013 Elsevier Interactive Patient Education  2017 Elsevier Inc. 

## 2022-05-20 LAB — IGG CSF INDEX
Albumin CSF-mCnc: 18 mg/dL (ref 7–29)
Albumin: 4.2 g/dL (ref 4.0–5.0)
CSF IgG Index: 0.5 (ref 0.0–0.7)
IgG (Immunoglobin G), Serum: 820 mg/dL (ref 586–1602)
IgG, CSF: 1.8 mg/dL (ref 0.0–6.7)
IgG/Alb Ratio, CSF: 0.1 (ref 0.00–0.25)

## 2022-05-21 LAB — ANGIOTENSIN CONVERTING ENZYME, CSF: Angio Convert Enzyme: <1.5 U/L (ref 0.0–2.5)

## 2022-05-22 LAB — OLIGOCLONAL BANDS, CSF + SERM

## 2022-05-23 ENCOUNTER — Emergency Department
Admission: EM | Admit: 2022-05-23 | Discharge: 2022-05-23 | Disposition: A | Discharge status: Home / Self Care | Payer: BC Managed Care – PPO | Attending: Emergency Medicine | Admitting: Emergency Medicine

## 2022-05-23 ENCOUNTER — Other Ambulatory Visit: Payer: Self-pay

## 2022-05-23 DIAGNOSIS — I10 Essential (primary) hypertension: Secondary | ICD-10-CM | POA: Diagnosis not present

## 2022-05-23 DIAGNOSIS — M542 Cervicalgia: Secondary | ICD-10-CM | POA: Diagnosis present

## 2022-05-23 DIAGNOSIS — R197 Diarrhea, unspecified: Secondary | ICD-10-CM | POA: Diagnosis not present

## 2022-05-23 DIAGNOSIS — M545 Low back pain, unspecified: Secondary | ICD-10-CM | POA: Diagnosis not present

## 2022-05-23 DIAGNOSIS — E11649 Type 2 diabetes mellitus with hypoglycemia without coma: Secondary | ICD-10-CM | POA: Insufficient documentation

## 2022-05-23 LAB — CBC WITH DIFFERENTIAL/PLATELET
Abs Immature Granulocytes: 0.04 10*3/uL (ref 0.00–0.07)
Basophils Absolute: 0 10*3/uL (ref 0.0–0.1)
Basophils Relative: 0 %
Eosinophils Absolute: 0.1 10*3/uL (ref 0.0–0.5)
Eosinophils Relative: 2 %
HCT: 39.7 % (ref 36.0–46.0)
Hemoglobin: 12.8 g/dL (ref 12.0–15.0)
Immature Granulocytes: 1 %
Lymphocytes Relative: 32 %
Lymphs Abs: 2.3 10*3/uL (ref 0.7–4.0)
MCH: 27.6 pg (ref 26.0–34.0)
MCHC: 32.2 g/dL (ref 30.0–36.0)
MCV: 85.6 fL (ref 80.0–100.0)
Monocytes Absolute: 0.4 10*3/uL (ref 0.1–1.0)
Monocytes Relative: 6 %
Neutro Abs: 4.2 10*3/uL (ref 1.7–7.7)
Neutrophils Relative %: 59 %
Platelets: 243 10*3/uL (ref 150–400)
RBC: 4.64 MIL/uL (ref 3.87–5.11)
RDW: 13.2 % (ref 11.5–15.5)
WBC: 7 10*3/uL (ref 4.0–10.5)
nRBC: 0 % (ref 0.0–0.2)

## 2022-05-23 LAB — COMPREHENSIVE METABOLIC PANEL
ALT: 28 U/L (ref 0–44)
AST: 25 U/L (ref 15–41)
Albumin: 4.2 g/dL (ref 3.5–5.0)
Alkaline Phosphatase: 29 U/L — ABNORMAL LOW (ref 38–126)
Anion gap: 10 (ref 5–15)
BUN: 23 mg/dL — ABNORMAL HIGH (ref 6–20)
CO2: 20 mmol/L — ABNORMAL LOW (ref 22–32)
Calcium: 10.1 mg/dL (ref 8.9–10.3)
Chloride: 106 mmol/L (ref 98–111)
Creatinine, Ser: 0.9 mg/dL (ref 0.44–1.00)
GFR, Estimated: >60 mL/min (ref >60)
Glucose, Bld: 178 mg/dL — ABNORMAL HIGH (ref 70–99)
Potassium: 3.2 mmol/L — ABNORMAL LOW (ref 3.5–5.1)
Sodium: 136 mmol/L (ref 135–145)
Total Bilirubin: 0.5 mg/dL (ref 0.3–1.2)
Total Protein: 7.6 g/dL (ref 6.5–8.1)

## 2022-05-23 MED ORDER — ONDANSETRON HCL 4 MG/2ML IJ SOLN
4.0000 mg | Freq: Once | INTRAMUSCULAR | Status: AC
  Administered 2022-05-23: 4 mg via INTRAVENOUS

## 2022-05-23 MED ORDER — SODIUM CHLORIDE 0.9 % IV BOLUS
1000.0000 mL | Freq: Once | INTRAVENOUS | Status: AC
  Administered 2022-05-23: 1000 mL via INTRAVENOUS

## 2022-05-23 MED ORDER — ONDANSETRON HCL 4 MG/2ML IJ SOLN
INTRAMUSCULAR | Status: AC
  Filled 2022-05-23: qty 2

## 2022-05-23 MED ORDER — POTASSIUM CHLORIDE CRYS ER 20 MEQ PO TBCR
40.0000 meq | EXTENDED_RELEASE_TABLET | Freq: Once | ORAL | Status: AC
  Administered 2022-05-23: 40 meq via ORAL
  Filled 2022-05-23: qty 2

## 2022-05-23 MED ORDER — CYCLOBENZAPRINE HCL 5 MG PO TABS: 5.0000 mg | ORAL_TABLET | Freq: Three times a day (TID) | ORAL | 0 refills | Status: AC | PRN

## 2022-05-23 MED ORDER — KETOROLAC TROMETHAMINE 15 MG/ML IJ SOLN
15.0000 mg | Freq: Once | INTRAMUSCULAR | Status: AC
  Administered 2022-05-23: 15 mg via INTRAVENOUS
  Filled 2022-05-23: qty 1
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
## 2022-05-23 NOTE — Discharge Instructions (Addendum)
You can take the Flexeril in addition to Tylenol and Motrin if you continue to have pain.  I would also try heating pad to help loosen the muscles.  Please follow-up with your neurologist.  If your pain is worsening you develop weakness numbness or fevers please return to the emergency department.
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
## 2022-08-02 LAB — PROINSULIN/INSULIN RATIO
Insulin: 120 u[IU]/mL — ABNORMAL HIGH
Proinsulin: 32.6 pmol/L — ABNORMAL HIGH

## 2022-08-09 DIAGNOSIS — G932 Benign intracranial hypertension: Secondary | ICD-10-CM | POA: Insufficient documentation

## 2022-08-14 HISTORY — PX: VENTRICULO-PERITONEAL SHUNT PLACEMENT / LAPAROSCOPIC INSERTION PERITONEAL CATHETER: SUR1040

## 2022-09-30 ENCOUNTER — Emergency Department
Admission: EM | Admit: 2022-09-30 | Discharge: 2022-09-30 | Disposition: A | Discharge status: Home / Self Care | Payer: BC Managed Care – PPO | Attending: Emergency Medicine | Admitting: Emergency Medicine

## 2022-09-30 ENCOUNTER — Other Ambulatory Visit: Payer: Self-pay

## 2022-09-30 ENCOUNTER — Emergency Department: Payer: BC Managed Care – PPO

## 2022-09-30 DIAGNOSIS — R197 Diarrhea, unspecified: Secondary | ICD-10-CM | POA: Diagnosis not present

## 2022-09-30 DIAGNOSIS — R109 Unspecified abdominal pain: Secondary | ICD-10-CM | POA: Insufficient documentation

## 2022-09-30 DIAGNOSIS — R110 Nausea: Secondary | ICD-10-CM | POA: Insufficient documentation

## 2022-09-30 LAB — CBC
HCT: 42.4 % (ref 36.0–46.0)
Hemoglobin: 13.4 g/dL (ref 12.0–15.0)
MCH: 27.6 pg (ref 26.0–34.0)
MCHC: 31.6 g/dL (ref 30.0–36.0)
MCV: 87.2 fL (ref 80.0–100.0)
Platelets: 291 10*3/uL (ref 150–400)
RBC: 4.86 MIL/uL (ref 3.87–5.11)
RDW: 12.7 % (ref 11.5–15.5)
WBC: 5 10*3/uL (ref 4.0–10.5)
nRBC: 0 % (ref 0.0–0.2)

## 2022-09-30 LAB — COMPREHENSIVE METABOLIC PANEL
ALT: 57 U/L — ABNORMAL HIGH (ref 0–44)
AST: 35 U/L (ref 15–41)
Albumin: 3.9 g/dL (ref 3.5–5.0)
Alkaline Phosphatase: 35 U/L — ABNORMAL LOW (ref 38–126)
Anion gap: 9 (ref 5–15)
BUN: 13 mg/dL (ref 6–20)
CO2: 15 mmol/L — ABNORMAL LOW (ref 22–32)
Calcium: 8.2 mg/dL — ABNORMAL LOW (ref 8.9–10.3)
Chloride: 113 mmol/L — ABNORMAL HIGH (ref 98–111)
Creatinine, Ser: 0.7 mg/dL (ref 0.44–1.00)
GFR, Estimated: >60 mL/min (ref >60)
Glucose, Bld: 113 mg/dL — ABNORMAL HIGH (ref 70–99)
Potassium: 3.5 mmol/L (ref 3.5–5.1)
Sodium: 137 mmol/L (ref 135–145)
Total Bilirubin: 0.4 mg/dL (ref 0.3–1.2)
Total Protein: 7.3 g/dL (ref 6.5–8.1)

## 2022-09-30 LAB — LIPASE, BLOOD: Lipase: 32 U/L (ref 11–51)

## 2022-09-30 MED ORDER — SODIUM CHLORIDE 0.9 % IV BOLUS
1000.0000 mL | Freq: Once | INTRAVENOUS | Status: AC
  Administered 2022-09-30: 1000 mL via INTRAVENOUS

## 2022-09-30 MED ORDER — ONDANSETRON HCL 4 MG/2ML IJ SOLN
4.0000 mg | Freq: Once | INTRAMUSCULAR | Status: AC
  Administered 2022-09-30: 4 mg via INTRAVENOUS
  Filled 2022-09-30: qty 2

## 2022-09-30 MED ORDER — DICYCLOMINE HCL 10 MG PO CAPS: 10.0000 mg | ORAL_CAPSULE | Freq: Three times a day (TID) | ORAL | 0 refills | Status: DC | PRN

## 2022-09-30 NOTE — ED Provider Notes (Signed)
[facility] Provider Note    Event Date/Time   First MD Initiated Contact with Patient 09/30/22 0935     (approximate)   History   Diarrhea   HPI  Julie Estrada is a 32 y.o. female  who presents to the emergency department today because of concern for diarrhea. Patient says she thinks she might have gotten food poisoning after eating a poke bowl 5 days ago. Since then she has had frequent non bloody diarrhea. This has been accompanied by nausea and abdominal discomfort. The patient denies any fevers. Also reports that she had a vp shunt placed roughly 6 weeks ago which she is also concerned about.        Physical Exam   Triage Vital Signs: ED Triage Vitals  Enc Vitals Group     BP 09/30/22 0709 (!) 118/52     Pulse Rate 09/30/22 0709 78     Resp 09/30/22 0709 16     Temp 09/30/22 0709 98.3 F (36.8 C)     Temp Source 09/30/22 0709 Oral     SpO2 09/30/22 0709 98 %     Weight 09/30/22 0710 258 lb (117 kg)     Height 09/30/22 0710 '5\' 3"'$  (1.6 m)     Head Circumference --      Peak Flow --      Pain Score 09/30/22 0713 4     Pain Loc --      Pain Edu? --      Excl. in [place]? --     Most recent vital signs: Vitals:   09/30/22 0709  BP: (!) 118/52  Pulse: 78  Resp: 16  Temp: 98.3 F (36.8 C)  SpO2: 98%   General: Awake, no distress.  CV:  Good peripheral perfusion.  Resp:  Normal effort.  Abd:  No distention. No tenderness.    ED Results / Procedures / Treatments   Labs (all labs ordered are listed, but only abnormal results are displayed) Labs Reviewed  COMPREHENSIVE METABOLIC PANEL - Abnormal; Notable for the following components:      Result Value   Chloride 113 (*)    CO2 15 (*)    Glucose, Bld 113 (*)    Calcium 8.2 (*)    ALT 57 (*)    Alkaline Phosphatase 35 (*)    All other components within normal limits  LIPASE, BLOOD  CBC  URINALYSIS, ROUTINE W REFLEX MICROSCOPIC     EKG  None   RADIOLOGY I  independently interpreted and visualized the abd x-ray. My interpretation: No acute abnormality Radiology interpretation:  IMPRESSION:  1. Nonspecific, nonobstructive bowel gas pattern.  2. Right-sided ventriculoperitoneal shunt intact with tip over the  left pelvis.    I independently interpreted and visualized the skull x-ray. My interpretation: No acute abnormality Radiology interpretation:  IMPRESSION:  Right-sided ventriculoperitoneal shunt intact with tip just left of  midline over the frontal region.    I independently interpreted and visualized the chest x-ray. My interpretation: No acute abnormality Radiology interpretation:  IMPRESSION:  1. No acute cardiopulmonary disease.  2. Right-sided ventriculoperitoneal shunt intact.    I independently interpreted and visualized the cervical spine x-ray. My interpretation: No acute abnormality Radiology interpretation:  IMPRESSION:  No acute findings.     PROCEDURES:  Critical Care performed: No  Procedures   MEDICATIONS ORDERED IN ED: Medications - No data to display   IMPRESSION / MDM / [place] / ED COURSE  I  reviewed the triage vital signs and the nursing notes.                              Differential diagnosis includes, but is not limited to, gastroenteritis, food poisoning, dehydration, diverticulitis, ibs, mesenteric ischemia.  Patient's presentation is most consistent with acute presentation with potential threat to life or bodily function.  Patient presented to the emergency department today because of concern for diarrhea and abdominal pain. Also had concern for recent VP shunt. Shunt series without concerning findings. Blood work without concerning leukocytosis or electrolyte abnormality. At this time I think gastroenteritis likely. Patient was given IVFs. Will plan on discharging with prescription for bentyl. Patient states she has zofran already at home.      FINAL CLINICAL IMPRESSION(S)  / ED DIAGNOSES   Final diagnoses:  Diarrhea, unspecified type    Note:  This document was prepared using Dragon voice recognition software and may include unintentional dictation errors.    Nance Pear, MD 09/30/22 [phone]

## 2022-09-30 NOTE — ED Notes (Signed)
Additional orders placed by Dr Jacelyn Grip

## 2022-09-30 NOTE — Discharge Instructions (Signed)
Please seek medical attention for any high fevers, chest pain, shortness of breath, change in behavior, persistent vomiting, bloody stool or any other new or concerning symptoms.  

## 2022-09-30 NOTE — ED Triage Notes (Addendum)
Pt to ED POV, reports thinks has food poisoning since eating sushi on Wednesday. Reports diarrhea and nausea since Thursday. Denies emesis Reports taking zofran with no relief VP shunt placed 6 weeks ago.

## 2022-11-04 DIAGNOSIS — T782XXA Anaphylactic shock, unspecified, initial encounter: Secondary | ICD-10-CM | POA: Insufficient documentation

## 2022-11-04 DIAGNOSIS — R232 Flushing: Secondary | ICD-10-CM | POA: Insufficient documentation

## 2023-02-24 ENCOUNTER — Ambulatory Visit: Payer: Self-pay | Admitting: Surgery

## 2023-02-24 NOTE — H&P (Signed)
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 female who was referred by Mechele Collin, NP for evaluation of above. First noted several months ago.  Recently more uncomfortable, increasing in size.   Past Medical History:  has a past medical history of Allergic state, Anemia, Anxiety, Asthma, unspecified asthma severity, unspecified whether complicated, unspecified whether persistent (HHS-HCC), Breast discharge, Chicken pox, COVID-19 virus infection (03/09/2020), Depression, Diabetes mellitus type 2, uncomplicated (CMS/HHS-HCC) (2015), GAD (generalized anxiety disorder), Ganglion cyst of wrist, right (12/08/2015), GERD (gastroesophageal reflux disease), Hypertension, Hypertriglyceridemia (12/28/2019), Migraine headache, Near syncope, PMDD (premenstrual dysphoric disorder), PONV (postoperative nausea and vomiting), and PTSD (post-traumatic stress disorder).  Past Surgical History:  has a past surgical history that includes Appendectomy; Ganglion cyst of extensor tendon sheath, right dorsal wrist  (Right, 12/06/2015); Cesarean section (10/12/2016); Tubal ligation (10/12/2016); Fibroid embolization (04/2018); Hysterectomy (04/2019); Spine surgery (2018); Tonsillectomy (07/2019); insertion ventriculo-peritoneal shunt (Right, 08/14/2022); and insertion ventriculo-peritoneal shunt (Right, 08/14/2022).  Family History: family history includes Alcohol abuse in her paternal grandfather; Anxiety in her sister; Breast cancer in her paternal aunt; Coronary Artery Disease (Blocked arteries around heart) in her maternal grandfather and paternal grandmother; Diabetes in her father, maternal grandfather, maternal grandmother, and paternal grandmother; Diabetes type II in her father, maternal grandfather, maternal grandmother, paternal grandfather, and paternal grandmother; Gout in her father; Heart disease in her father and paternal grandmother; High blood pressure (Hypertension) in  her father, maternal grandfather, maternal grandmother, and paternal grandmother; Hyperlipidemia (Elevated cholesterol) in her father, maternal grandfather, maternal grandmother, mother, paternal grandmother, sister, and sister; Kidney disease in her father and paternal grandfather; Leukemia in her maternal grandfather; Lung cancer in her paternal grandfather; Myocardial Infarction (Heart attack) in her maternal grandmother; No Known Problems in her daughter and daughter; Obesity in her sister; Sleep apnea in her maternal grandfather, maternal grandmother, and paternal grandmother; Stroke in her paternal grandmother; Thyroid disease in her mother.  Social History:  reports that she quit smoking about 3 years ago. Her smoking use included cigarettes. She started smoking about 14 years ago. She has a 5.5 pack-year smoking history. She has never used smokeless tobacco. She reports current alcohol use. She reports that she does not use drugs.  Current Medications: has a current medication list which includes the following prescription(s): acetaminophen, albuterol mdi (proventil, ventolin, proair) hfa, cholecalciferol, clonazepam, cyanocobalamin, docusate, epinephrine, fenofibrate micronized, ferrous sulfate, gvoke hypopen 2-pack, losartan, magnesium oxide, omeprazole, topiramate, venlafaxine, freestyle libre 3 sensor, silver sulfadiazine, and trazodone.  Allergies:  Allergies  Allergen Reactions   Doxycycline Vomiting and Nausea And Vomiting   Iodine Anaphylaxis    Topical iodine okay   Penicillins Anaphylaxis and Nausea And Vomiting           Shellfish Containing Products Anaphylaxis   Gadobenate Dimeglumine Nausea And Vomiting   Omnicef [Cefdinir] Itching and Rash   Cephalosporins Nausea And Vomiting   Gadolinium-Containing Contrast Media Nausea and Vomiting   Morphine Anxiety and Other (See Comments)    Panic attacks    ROS:  A 15 point review of systems was performed and pertinent  positives and negatives noted in HPI   Objective:     BP 120/77   Pulse 83   Ht 160 cm (5' 2.99")   Wt (!) 117.6 kg (259 lb 4.2 oz)   LMP 09/12/2018 (Exact Date)   BMI 45.94 kg/m   Constitutional :  No distress, cooperative, alert  Lymphatics/Throat:  Supple with no  lymphadenopathy  Respiratory:  Clear to auscultation bilaterally  Cardiovascular:  Regular rate and rhythm  Gastrointestinal: Soft, non-tender, non-distended, no organomegaly.  Musculoskeletal: Steady gait and movement  Skin: Cool and moist. Right hip, slightly anterior to greater trochanter is somewhat difficult to palpate nodule, smooth, TTP, size of quarter  Psychiatric: Normal affect, non-agitated, not confused         LABS:  N/a   RADS: CLINICAL DATA:  Localized skin mass, lump, or swelling RIGHT  ANTERIOR HIP   EXAM:  ULTRASOUND RIGHT LOWER EXTREMITY LIMITED   TECHNIQUE:  Ultrasound examination of the lower extremity soft tissues was  performed in the area of clinical concern.   COMPARISON:  None Available.   FINDINGS:  In the palpable area, there is a 2.7 x 2.3 x 1 cm mildly hyperechoic  mass without flow on color Doppler.   IMPRESSION:  Palpable abnormality is most consistent with a lipoma.    Electronically Signed    By: Guadlupe Spanish M.D.    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  Alternatives include continued observation.  Benefits include possible symptom relief, pathologic evaluation, improved cosmesis. Discussed the risk of surgery including recurrence, chronic pain, post-op infxn, poor cosmesis, poor/delayed wound healing, and possible re-operation to address said risks. The risks of general anesthetic, if used, includes MI, CVA, sudden death or even reaction to anesthetic medications also discussed.  Typical post-op recovery time of 3-5 days with possible activity restrictions were also  discussed.  The patient verbalized understanding and all questions were answered to the patient's satisfaction.  2. Patient has elected to proceed with surgical treatment. Procedure will be scheduled. Slight left side down for positioning.  Need to mark in preop  labs/images/medications/previous chart entries reviewed personally and relevant changes/updates noted above.

## 2023-02-24 NOTE — H&P (View-Only) (Signed)
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 female who was referred by Shannon Hiatt Coward, NP for evaluation of above. First noted several months ago.  Recently more uncomfortable, increasing in size.   Past Medical History:  has a past medical history of Allergic state, Anemia, Anxiety, Asthma, unspecified asthma severity, unspecified whether complicated, unspecified whether persistent (HHS-HCC), Breast discharge, Chicken pox, COVID-19 virus infection (03/09/2020), Depression, Diabetes mellitus type 2, uncomplicated (CMS/HHS-HCC) (2015), GAD (generalized anxiety disorder), Ganglion cyst of wrist, right (12/08/2015), GERD (gastroesophageal reflux disease), Hypertension, Hypertriglyceridemia (12/28/2019), Migraine headache, Near syncope, PMDD (premenstrual dysphoric disorder), PONV (postoperative nausea and vomiting), and PTSD (post-traumatic stress disorder).  Past Surgical History:  has a past surgical history that includes Appendectomy; Ganglion cyst of extensor tendon sheath, right dorsal wrist  (Right, 12/06/2015); Cesarean section (10/12/2016); Tubal ligation (10/12/2016); Fibroid embolization (04/2018); Hysterectomy (04/2019); Spine surgery (2018); Tonsillectomy (07/2019); insertion ventriculo-peritoneal shunt (Right, 08/14/2022); and insertion ventriculo-peritoneal shunt (Right, 08/14/2022).  Family History: family history includes Alcohol abuse in her paternal grandfather; Anxiety in her sister; Breast cancer in her paternal aunt; Coronary Artery Disease (Blocked arteries around heart) in her maternal grandfather and paternal grandmother; Diabetes in her father, maternal grandfather, maternal grandmother, and paternal grandmother; Diabetes type II in her father, maternal grandfather, maternal grandmother, paternal grandfather, and paternal grandmother; Gout in her father; Heart disease in her father and paternal grandmother; High blood pressure (Hypertension) in  her father, maternal grandfather, maternal grandmother, and paternal grandmother; Hyperlipidemia (Elevated cholesterol) in her father, maternal grandfather, maternal grandmother, mother, paternal grandmother, sister, and sister; Kidney disease in her father and paternal grandfather; Leukemia in her maternal grandfather; Lung cancer in her paternal grandfather; Myocardial Infarction (Heart attack) in her maternal grandmother; No Known Problems in her daughter and daughter; Obesity in her sister; Sleep apnea in her maternal grandfather, maternal grandmother, and paternal grandmother; Stroke in her paternal grandmother; Thyroid disease in her mother.  Social History:  reports that she quit smoking about 3 years ago. Her smoking use included cigarettes. She started smoking about 14 years ago. She has a 5.5 pack-year smoking history. She has never used smokeless tobacco. She reports current alcohol use. She reports that she does not use drugs.  Current Medications: has a current medication list which includes the following prescription(s): acetaminophen, albuterol mdi (proventil, ventolin, proair) hfa, cholecalciferol, clonazepam, cyanocobalamin, docusate, epinephrine, fenofibrate micronized, ferrous sulfate, gvoke hypopen 2-pack, losartan, magnesium oxide, omeprazole, topiramate, venlafaxine, freestyle libre 3 sensor, silver sulfadiazine, and trazodone.  Allergies:  Allergies  Allergen Reactions   Doxycycline Vomiting and Nausea And Vomiting   Iodine Anaphylaxis    Topical iodine okay   Penicillins Anaphylaxis and Nausea And Vomiting           Shellfish Containing Products Anaphylaxis   Gadobenate Dimeglumine Nausea And Vomiting   Omnicef [Cefdinir] Itching and Rash   Cephalosporins Nausea And Vomiting   Gadolinium-Containing Contrast Media Nausea and Vomiting   Morphine Anxiety and Other (See Comments)    Panic attacks    ROS:  A 15 point review of systems was performed and pertinent  positives and negatives noted in HPI   Objective:     BP 120/77   Pulse 83   Ht 160 cm (5' 2.99")   Wt (!) 117.6 kg (259 lb 4.2 oz)   LMP 09/12/2018 (Exact Date)   BMI 45.94 kg/m   Constitutional :  No distress, cooperative, alert  Lymphatics/Throat:  Supple with no   lymphadenopathy  Respiratory:  Clear to auscultation bilaterally  Cardiovascular:  Regular rate and rhythm  Gastrointestinal: Soft, non-tender, non-distended, no organomegaly.  Musculoskeletal: Steady gait and movement  Skin: Cool and moist. Right hip, slightly anterior to greater trochanter is somewhat difficult to palpate nodule, smooth, TTP, size of quarter  Psychiatric: Normal affect, non-agitated, not confused         LABS:  N/a   RADS: CLINICAL DATA:  Localized skin mass, lump, or swelling RIGHT  ANTERIOR HIP   EXAM:  ULTRASOUND RIGHT LOWER EXTREMITY LIMITED   TECHNIQUE:  Ultrasound examination of the lower extremity soft tissues was  performed in the area of clinical concern.   COMPARISON:  None Available.   FINDINGS:  In the palpable area, there is a 2.7 x 2.3 x 1 cm mildly hyperechoic  mass without flow on color Doppler.   IMPRESSION:  Palpable abnormality is most consistent with a lipoma.    Electronically Signed    By: Praneil  Patel M.D.    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  Alternatives include continued observation.  Benefits include possible symptom relief, pathologic evaluation, improved cosmesis. Discussed the risk of surgery including recurrence, chronic pain, post-op infxn, poor cosmesis, poor/delayed wound healing, and possible re-operation to address said risks. The risks of general anesthetic, if used, includes MI, CVA, sudden death or even reaction to anesthetic medications also discussed.  Typical post-op recovery time of 3-5 days with possible activity restrictions were also  discussed.  The patient verbalized understanding and all questions were answered to the patient's satisfaction.  2. Patient has elected to proceed with surgical treatment. Procedure will be scheduled. Slight left side down for positioning.  Need to mark in preop  labs/images/medications/previous chart entries reviewed personally and relevant changes/updates noted above.  

## 2023-03-06 ENCOUNTER — Encounter
Admission: RE | Admit: 2023-03-06 | Discharge: 2023-03-06 | Disposition: A | Discharge status: Home / Self Care | Payer: BC Managed Care – PPO | Source: Ambulatory Visit | Attending: Surgery | Admitting: Surgery

## 2023-03-06 VITALS — Ht 63.0 in | Wt 255.0 lb

## 2023-03-06 DIAGNOSIS — I10 Essential (primary) hypertension: Secondary | ICD-10-CM

## 2023-03-06 DIAGNOSIS — Z01812 Encounter for preprocedural laboratory examination: Secondary | ICD-10-CM

## 2023-03-06 DIAGNOSIS — E162 Hypoglycemia, unspecified: Secondary | ICD-10-CM

## 2023-03-06 HISTORY — DX: Morbid (severe) obesity due to excess calories: E66.01

## 2023-03-06 HISTORY — DX: Migraine, unspecified, not intractable, without status migrainosus: G43.909

## 2023-03-06 HISTORY — DX: Prediabetes: R73.03

## 2023-03-06 HISTORY — DX: Personal history of urinary calculi: Z87.442

## 2023-03-06 HISTORY — DX: Benign intracranial hypertension: G93.2

## 2023-03-06 HISTORY — DX: Pure hyperglyceridemia: E78.1

## 2023-03-06 HISTORY — DX: Gastro-esophageal reflux disease without esophagitis: K21.9

## 2023-03-06 HISTORY — DX: Unspecified asthma, uncomplicated: J45.909

## 2023-03-06 NOTE — Patient Instructions (Addendum)
Your procedure is scheduled on: Friday, June 21 Report to the Registration Desk on the 1st floor of the [facility] To find out your arrival time, please call [phone] between 1PM - 3PM on: Thursday, June 20 If your arrival time is 6:00 am, do not arrive before that time as the [facility] entrance doors do not open until 6:00 am.  REMEMBER: Instructions that are not followed completely may result in serious medical risk, up to and including death; or upon the discretion of your surgeon and anesthesiologist your surgery may need to be rescheduled.  Do not eat food after midnight the night before surgery.  No gum chewing or hard candies.  You may however, drink CLEAR liquids up to 2 hours before you are scheduled to arrive for your surgery. Do not drink anything within 2 hours of your scheduled arrival time.  Clear liquids include: - water  - apple juice without pulp - gatorade (not RED colors) - black coffee or tea (Do NOT add milk or creamers to the coffee or tea) Do NOT drink anything that is not on this list.  One week prior to surgery: starting June 14 Stop Anti-inflammatories (NSAIDS) such as Advil, Aleve, Ibuprofen, Motrin, Naproxen, Naprosyn and Aspirin based products such as Excedrin, Goody's Powder, BC Powder. Stop ANY OVER THE COUNTER supplements until after surgery. Stop vitamin D, vitamin B, magnesium. You may however, continue to take Tylenol if needed for pain up until the day of surgery.  Continue taking all prescribed medications   TAKE THESE MEDICATIONS THE MORNING OF SURGERY WITH A SIP OF WATER:  ESOMEPRAZOLE (NEXIUM) - take one the night before surgery and one the morning of surgery;helps to prevent nausea after surgery. 2.  CLONAZEPAM (IF NEEDED FOR ANXIETY)  BRING YOUR ALBUTEROL INHALER TO USE BEFORE SURGERY.  No Alcohol for 24 hours before or after surgery.  No Smoking including e-cigarettes for 24 hours before surgery.  No chewable tobacco products  for at least 6 hours before surgery.  No nicotine patches on the day of surgery.  Do not use any "recreational" drugs for at least a week (preferably 2 weeks) before your surgery.  Please be advised that the combination of cocaine and anesthesia may have negative outcomes, up to and including death. If you test positive for cocaine, your surgery will be cancelled.  On the morning of surgery brush your teeth with toothpaste and water, you may rinse your mouth with mouthwash if you wish. Do not swallow any toothpaste or mouthwash.  Use CHG Soap as directed on instruction sheet.  Do not wear jewelry, make-up, hairpins, clips or nail polish.  Do not wear lotions, powders, or perfumes.   Do not shave body hair from the neck down 48 hours before surgery.  Contact lenses, hearing aids and dentures may not be worn into surgery.  Do not bring valuables to the hospital. [facility] is not responsible for any missing/lost belongings or valuables.   Notify your doctor if there is any change in your medical condition (cold, fever, infection).  Wear comfortable clothing (specific to your surgery type) to the hospital.  After surgery, you can help prevent lung complications by doing breathing exercises.  Take deep breaths and cough every 1-2 hours. Your doctor may order a device called an Incentive Spirometer to help you take deep breaths.  If you are being discharged the day of surgery, you will not be allowed to drive home. You will need a responsible individual to  drive you home and stay with you for 24 hours after surgery.   If you are taking public transportation, you will need to have a responsible individual with you.  Please call the Pre-admissions Testing Dept. at [phone] if you have any questions about these instructions.  Surgery Visitation Policy:  Patients having surgery or a procedure may have two visitors.  Children under the age of 34 must have an adult with them who is  not the patient.     Preparing for Surgery with CHLORHEXIDINE GLUCONATE (CHG) Soap  Chlorhexidine Gluconate (CHG) Soap  o An antiseptic cleaner that kills germs and bonds with the skin to continue killing germs even after washing  o Used for showering the night before surgery and morning of surgery  Before surgery, you can play an important role by reducing the number of germs on your skin.  CHG (Chlorhexidine gluconate) soap is an antiseptic cleanser which kills germs and bonds with the skin to continue killing germs even after washing.  Please do not use if you have an allergy to CHG or antibacterial soaps. If your skin becomes reddened/irritated stop using the CHG.  1. Shower the NIGHT BEFORE SURGERY and the MORNING OF SURGERY with CHG soap.  2. If you choose to wash your hair, wash your hair first as usual with your normal shampoo.  3. After shampooing, rinse your hair and body thoroughly to remove the shampoo.  4. Use CHG as you would any other liquid soap. You can apply CHG directly to the skin and wash gently with a scrungie or a clean washcloth.  5. Apply the CHG soap to your body only from the neck down. Do not use on open wounds or open sores. Avoid contact with your eyes, ears, mouth, and genitals (private parts). Wash face and genitals (private parts) with your normal soap.  6. Wash thoroughly, paying special attention to the area where your surgery will be performed.  7. Thoroughly rinse your body with warm water.  8. Do not shower/wash with your normal soap after using and rinsing off the CHG soap.  9. Pat yourself dry with a clean towel.  10. Wear clean pajamas to bed the night before surgery.  12. Place clean sheets on your bed the night of your first shower and do not sleep with pets.  13. Shower again with the CHG soap on the day of surgery prior to arriving at the hospital.  14. Do not apply any deodorants/lotions/powders.  15. Please wear clean clothes  to the hospital.

## 2023-03-07 ENCOUNTER — Encounter
Admission: RE | Admit: 2023-03-07 | Discharge: 2023-03-07 | Disposition: A | Discharge status: Home / Self Care | Payer: BC Managed Care – PPO | Source: Ambulatory Visit | Attending: Surgery | Admitting: Surgery

## 2023-03-07 DIAGNOSIS — I10 Essential (primary) hypertension: Secondary | ICD-10-CM | POA: Insufficient documentation

## 2023-03-07 DIAGNOSIS — Z01812 Encounter for preprocedural laboratory examination: Secondary | ICD-10-CM

## 2023-03-07 DIAGNOSIS — Z01818 Encounter for other preprocedural examination: Secondary | ICD-10-CM | POA: Diagnosis not present

## 2023-03-07 LAB — BASIC METABOLIC PANEL
Anion gap: 12 (ref 5–15)
BUN: 25 mg/dL — ABNORMAL HIGH (ref 6–20)
CO2: 20 mmol/L — ABNORMAL LOW (ref 22–32)
Calcium: 9.1 mg/dL (ref 8.9–10.3)
Chloride: 103 mmol/L (ref 98–111)
Creatinine, Ser: 0.77 mg/dL (ref 0.44–1.00)
GFR, Estimated: >60 mL/min (ref >60)
Glucose, Bld: 99 mg/dL (ref 70–99)
Potassium: 3.4 mmol/L — ABNORMAL LOW (ref 3.5–5.1)
Sodium: 135 mmol/L (ref 135–145)

## 2023-03-07 LAB — CBC
HCT: 39.1 % (ref 36.0–46.0)
Hemoglobin: 12.8 g/dL (ref 12.0–15.0)
MCH: 28.1 pg (ref 26.0–34.0)
MCHC: 32.7 g/dL (ref 30.0–36.0)
MCV: 85.7 fL (ref 80.0–100.0)
Platelets: 314 10*3/uL (ref 150–400)
RBC: 4.56 MIL/uL (ref 3.87–5.11)
RDW: 12.4 % (ref 11.5–15.5)
WBC: 7.9 10*3/uL (ref 4.0–10.5)
nRBC: 0 % (ref 0.0–0.2)

## 2023-03-13 MED ORDER — ORAL CARE MOUTH RINSE: 15.0000 mL | Freq: Once | OROMUCOSAL | Status: AC

## 2023-03-13 MED ORDER — SODIUM CHLORIDE 0.9 % IV SOLN: INTRAVENOUS | Status: DC

## 2023-03-13 MED ORDER — VANCOMYCIN HCL 1500 MG/300ML IV SOLN
1500.0000 mg | INTRAVENOUS | Status: AC
  Administered 2023-03-14: 1500 mg via INTRAVENOUS
  Filled 2023-03-13: qty 300

## 2023-03-13 MED ORDER — CHLORHEXIDINE GLUCONATE 0.12 % MT SOLN
15.0000 mL | Freq: Once | OROMUCOSAL | Status: AC
  Administered 2023-03-14: 15 mL via OROMUCOSAL

## 2023-03-13 MED ORDER — CHLORHEXIDINE GLUCONATE CLOTH 2 % EX PADS
6.0000 | MEDICATED_PAD | Freq: Once | CUTANEOUS | Status: AC
  Administered 2023-03-14: 6 via TOPICAL

## 2023-03-14 ENCOUNTER — Encounter: Payer: Self-pay | Admitting: Surgery

## 2023-03-14 ENCOUNTER — Ambulatory Visit: Payer: BC Managed Care – PPO | Admitting: Urgent Care

## 2023-03-14 ENCOUNTER — Other Ambulatory Visit: Payer: Self-pay

## 2023-03-14 ENCOUNTER — Encounter
Admission: RE | Disposition: A | Discharge status: Home / Self Care | Payer: Self-pay | Source: Home / Self Care | Attending: Surgery

## 2023-03-14 ENCOUNTER — Ambulatory Visit
Admission: RE | Admit: 2023-03-14 | Discharge: 2023-03-14 | Disposition: A | Discharge status: Home / Self Care | Payer: BC Managed Care – PPO | Attending: Surgery | Admitting: Surgery

## 2023-03-14 DIAGNOSIS — E162 Hypoglycemia, unspecified: Secondary | ICD-10-CM

## 2023-03-14 DIAGNOSIS — D1720 Benign lipomatous neoplasm of skin and subcutaneous tissue of unspecified limb: Secondary | ICD-10-CM | POA: Diagnosis present

## 2023-03-14 DIAGNOSIS — D171 Benign lipomatous neoplasm of skin and subcutaneous tissue of trunk: Secondary | ICD-10-CM

## 2023-03-14 DIAGNOSIS — Z01812 Encounter for preprocedural laboratory examination: Secondary | ICD-10-CM

## 2023-03-14 HISTORY — PX: LIPOMA EXCISION: SHX5283

## 2023-03-14 LAB — GLUCOSE, CAPILLARY: Glucose-Capillary: 91 mg/dL (ref 70–99)

## 2023-03-14 SURGERY — EXCISION LIPOMA
Anesthesia: General | Site: Hip | Laterality: Right

## 2023-03-14 MED ORDER — LIDOCAINE HCL (PF) 2 % IJ SOLN
INTRAMUSCULAR | Status: AC
  Filled 2023-03-14: qty 5

## 2023-03-14 MED ORDER — ONDANSETRON HCL 4 MG/2ML IJ SOLN
INTRAMUSCULAR | Status: AC
  Filled 2023-03-14: qty 2

## 2023-03-14 MED ORDER — LIDOCAINE HCL (PF) 1 % IJ SOLN
INTRAMUSCULAR | Status: DC | PRN
  Administered 2023-03-14: 4 mL

## 2023-03-14 MED ORDER — PHENYLEPHRINE 80 MCG/ML (10ML) SYRINGE FOR IV PUSH (FOR BLOOD PRESSURE SUPPORT)
PREFILLED_SYRINGE | INTRAVENOUS | Status: AC
  Filled 2023-03-14: qty 10

## 2023-03-14 MED ORDER — BUPIVACAINE-EPINEPHRINE 0.5% -1:200000 IJ SOLN
INTRAMUSCULAR | Status: DC | PRN
  Administered 2023-03-14: 4 mL

## 2023-03-14 MED ORDER — MIDAZOLAM HCL 2 MG/2ML IJ SOLN
INTRAMUSCULAR | Status: DC | PRN
  Administered 2023-03-14: 2 mg via INTRAVENOUS

## 2023-03-14 MED ORDER — LIDOCAINE HCL (CARDIAC) PF 100 MG/5ML IV SOSY
PREFILLED_SYRINGE | INTRAVENOUS | Status: DC | PRN
  Administered 2023-03-14: 50 mg via INTRAVENOUS

## 2023-03-14 MED ORDER — SUCCINYLCHOLINE CHLORIDE 200 MG/10ML IV SOSY
PREFILLED_SYRINGE | INTRAVENOUS | Status: DC | PRN
  Administered 2023-03-14: 100 mg via INTRAVENOUS

## 2023-03-14 MED ORDER — MIDAZOLAM HCL 2 MG/2ML IJ SOLN
INTRAMUSCULAR | Status: AC
  Filled 2023-03-14: qty 2

## 2023-03-14 MED ORDER — IBUPROFEN 800 MG PO TABS: 800.0000 mg | ORAL_TABLET | Freq: Three times a day (TID) | ORAL | 0 refills | Status: DC | PRN

## 2023-03-14 MED ORDER — DEXAMETHASONE SODIUM PHOSPHATE 10 MG/ML IJ SOLN
INTRAMUSCULAR | Status: DC | PRN
  Administered 2023-03-14: 10 mg via INTRAVENOUS

## 2023-03-14 MED ORDER — CHLORHEXIDINE GLUCONATE 0.12 % MT SOLN
OROMUCOSAL | Status: AC
  Filled 2023-03-14: qty 15

## 2023-03-14 MED ORDER — PROPOFOL 500 MG/50ML IV EMUL
INTRAVENOUS | Status: DC | PRN
  Administered 2023-03-14: 140 ug/kg/min via INTRAVENOUS

## 2023-03-14 MED ORDER — DEXAMETHASONE SODIUM PHOSPHATE 10 MG/ML IJ SOLN
INTRAMUSCULAR | Status: AC
  Filled 2023-03-14: qty 1

## 2023-03-14 MED ORDER — PROPOFOL 10 MG/ML IV BOLUS
INTRAVENOUS | Status: DC | PRN
  Administered 2023-03-14: 200 mg via INTRAVENOUS

## 2023-03-14 MED ORDER — FENTANYL CITRATE (PF) 100 MCG/2ML IJ SOLN
INTRAMUSCULAR | Status: DC | PRN
  Administered 2023-03-14 (×2): 100 ug via INTRAVENOUS

## 2023-03-14 MED ORDER — ONDANSETRON HCL 4 MG/2ML IJ SOLN
INTRAMUSCULAR | Status: DC | PRN
  Administered 2023-03-14: 4 mg via INTRAVENOUS

## 2023-03-14 MED ORDER — BUPIVACAINE HCL (PF) 0.5 % IJ SOLN
INTRAMUSCULAR | Status: AC
  Filled 2023-03-14: qty 30

## 2023-03-14 MED ORDER — DEXMEDETOMIDINE HCL IN NACL 80 MCG/20ML IV SOLN
INTRAVENOUS | Status: DC | PRN
  Administered 2023-03-14: 20 ug via INTRAVENOUS

## 2023-03-14 MED ORDER — LIDOCAINE HCL (PF) 1 % IJ SOLN
INTRAMUSCULAR | Status: AC
  Filled 2023-03-14: qty 30

## 2023-03-14 MED ORDER — PHENYLEPHRINE HCL (PRESSORS) 10 MG/ML IV SOLN
INTRAVENOUS | Status: DC | PRN
  Administered 2023-03-14 (×2): 80 ug via INTRAVENOUS

## 2023-03-14 MED ORDER — OXYCODONE HCL 5 MG/5ML PO SOLN: 5.0000 mg | Freq: Once | ORAL | Status: DC | PRN

## 2023-03-14 MED ORDER — OXYCODONE HCL 5 MG PO TABS: 5.0000 mg | ORAL_TABLET | Freq: Once | ORAL | Status: DC | PRN

## 2023-03-14 MED ORDER — PROPOFOL 1000 MG/100ML IV EMUL
INTRAVENOUS | Status: AC
  Filled 2023-03-14: qty 100

## 2023-03-14 MED ORDER — FENTANYL CITRATE (PF) 100 MCG/2ML IJ SOLN: 25.0000 ug | INTRAMUSCULAR | Status: DC | PRN

## 2023-03-14 MED ORDER — OXYCODONE-ACETAMINOPHEN 5-325 MG PO TABS: 1.0000 | ORAL_TABLET | Freq: Three times a day (TID) | ORAL | 0 refills | Status: AC | PRN

## 2023-03-14 MED ORDER — FENTANYL CITRATE (PF) 100 MCG/2ML IJ SOLN
INTRAMUSCULAR | Status: AC
  Filled 2023-03-14: qty 2

## 2023-03-14 MED ORDER — GLYCOPYRROLATE 0.2 MG/ML IJ SOLN
INTRAMUSCULAR | Status: DC | PRN
  Administered 2023-03-14: .2 mg via INTRAVENOUS

## 2023-03-14 MED ORDER — EPINEPHRINE PF 1 MG/ML IJ SOLN
INTRAMUSCULAR | Status: AC
  Filled 2023-03-14: qty 1

## 2023-03-14 MED ORDER — GLYCOPYRROLATE 0.2 MG/ML IJ SOLN
INTRAMUSCULAR | Status: AC
  Filled 2023-03-14: qty 1

## 2023-03-14 SURGICAL SUPPLY — 35 items
ADH SKN CLS APL DERMABOND .7 (GAUZE/BANDAGES/DRESSINGS) ×1
APL PRP STRL LF DISP 70% ISPRP (MISCELLANEOUS) ×1
BLADE SURG 15 STRL LF DISP TIS (BLADE) ×1 IMPLANT
BLADE SURG 15 STRL SS (BLADE) ×1
CHLORAPREP W/TINT 26 (MISCELLANEOUS) ×1 IMPLANT
DERMABOND ADVANCED .7 DNX12 (GAUZE/BANDAGES/DRESSINGS) ×1 IMPLANT
DRAPE 3/4 80X56 (DRAPES) ×1 IMPLANT
DRAPE LAPAROTOMY 100X77 ABD (DRAPES) ×1 IMPLANT
ELECT CAUTERY BLADE 6.4 (BLADE) ×1 IMPLANT
ELECT REM PT RETURN 9FT ADLT (ELECTROSURGICAL) ×1
ELECTRODE REM PT RTRN 9FT ADLT (ELECTROSURGICAL) ×1 IMPLANT
GAUZE 4X4 16PLY ~~LOC~~+RFID DBL (SPONGE) ×1 IMPLANT
GLOVE BIOGEL PI IND STRL 7.0 (GLOVE) ×1 IMPLANT
GLOVE SURG SYN 6.5 ES PF (GLOVE) ×4 IMPLANT
GLOVE SURG SYN 6.5 PF PI (GLOVE) ×1 IMPLANT
GOWN STRL REUS W/ TWL LRG LVL3 (GOWN DISPOSABLE) ×2 IMPLANT
GOWN STRL REUS W/TWL LRG LVL3 (GOWN DISPOSABLE) ×4
KIT TURNOVER KIT A (KITS) ×1 IMPLANT
LABEL OR SOLS (LABEL) ×1 IMPLANT
MANIFOLD NEPTUNE II (INSTRUMENTS) ×1 IMPLANT
NDL HYPO 22X1.5 SAFETY MO (MISCELLANEOUS) ×1 IMPLANT
NEEDLE HYPO 22X1.5 SAFETY MO (MISCELLANEOUS) ×1 IMPLANT
NS IRRIG 1000ML POUR BTL (IV SOLUTION) ×1 IMPLANT
PACK BASIN MINOR ARMC (MISCELLANEOUS) ×1 IMPLANT
SUT ETHILON 3-0 FS-10 30 BLK (SUTURE)
SUT MNCRL 4-0 (SUTURE) ×1
SUT MNCRL 4-0 27XMFL (SUTURE) ×1
SUT VIC AB 3-0 SH 27 (SUTURE) ×1
SUT VIC AB 3-0 SH 27X BRD (SUTURE) ×1 IMPLANT
SUTURE EHLN 3-0 FS-10 30 BLK (SUTURE) IMPLANT
SUTURE MNCRL 4-0 27XMF (SUTURE) ×1 IMPLANT
SYR 30ML LL (SYRINGE) ×1 IMPLANT
TOWEL OR 17X26 4PK STRL BLUE (TOWEL DISPOSABLE) ×1 IMPLANT
TRAP FLUID SMOKE EVACUATOR (MISCELLANEOUS) ×1 IMPLANT
WATER STERILE IRR 500ML POUR (IV SOLUTION) ×1 IMPLANT
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
## 2023-03-14 NOTE — Anesthesia Postprocedure Evaluation (Signed)
Anesthesia Post Note  Patient: Julie Estrada  Procedure(s) Performed: EXCISION LIPOMA (Right: Hip)  Patient location during evaluation: PACU Anesthesia Type: General Level of consciousness: awake and alert Pain management: pain level controlled Vital Signs Assessment: post-procedure vital signs reviewed and stable Respiratory status: spontaneous breathing, nonlabored ventilation, respiratory function stable and patient connected to nasal cannula oxygen Cardiovascular status: blood pressure returned to baseline and stable Postop Assessment: no apparent nausea or vomiting Anesthetic complications: no  No notable events documented.   Last Vitals:  Vitals:   03/14/23 1045 03/14/23 1049  BP: (!) 112/46   Pulse: 92 79  Resp: (!) 26 19  Temp:    SpO2: 94% 95%    Last Pain:  Vitals:   03/14/23 1049  TempSrc:   PainSc: 0-No pain                 Stephanie Coup

## 2023-03-14 NOTE — Interval H&P Note (Signed)
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  The various methods of treatment have been discussed with the patient and family. After consideration of risks, benefits and other options for treatment, the patient has consented to  Procedure(s): EXCISION LIPOMA (Right) as a surgical intervention.  The patient's history has been reviewed, patient examined, no change in status, stable for surgery.  I have reviewed the patient's chart and labs.  Questions were answered to the patient's satisfaction.     Wandra Babin Tonna Boehringer

## 2023-03-14 NOTE — Discharge Instructions (Addendum)
Removal, Care After This sheet gives you information about how to care for yourself after your procedure. Your health care provider may also give you more specific instructions. If you have problems or questions, contact your health care provider. What can I expect after the procedure? After the procedure, it is common to have: Soreness. Bruising. Itching. Follow these instructions at home: site care Follow instructions from your health care provider about how to take care of your site. Make sure you: Wash your hands with soap and water before and after you change your bandage (dressing). If soap and water are not available, use hand sanitizer. Leave stitches (sutures), skin glue, or adhesive strips in place. These skin closures may need to stay in place for 2 weeks or longer. If adhesive strip edges start to loosen and curl up, you may trim the loose edges. Do not remove adhesive strips completely unless your health care provider tells you to do that. If the area bleeds or bruises, apply gentle pressure for 10 minutes. OK TO SHOWER IN 24HRS  Check your site every day for signs of infection. Check for: Redness, swelling, or pain. Fluid or blood. Warmth. Pus or a bad smell.  General instructions Rest and then return to your normal activities as told by your health care provider.  tylenol and advil as needed for discomfort.  Please alternate between the two every four hours as needed for pain.    Use narcotics, if prescribed, only when tylenol and motrin is not enough to control pain.  325-650mg every 8hrs to max of 3000mg/24hrs (including the 325mg in every norco dose) for the tylenol.    Advil up to 800mg per dose every 8hrs as needed for pain.   Keep all follow-up visits as told by your health care provider. This is important. Contact a health care provider if: You have redness, swelling, or pain around your site. You have fluid or blood coming from your site. Your site feels warm to  the touch. You have pus or a bad smell coming from your site. You have a fever. Your sutures, skin glue, or adhesive strips loosen or come off sooner than expected. Get help right away if: You have bleeding that does not stop with pressure or a dressing. Summary After the procedure, it is common to have some soreness, bruising, and itching at the site. Follow instructions from your health care provider about how to take care of your site. Check your site every day for signs of infection. Contact a health care provider if you have redness, swelling, or pain around your site, or your site feels warm to the touch. Keep all follow-up visits as told by your health care provider. This is important. This information is not intended to replace advice given to you by your health care provider. Make sure you discuss any questions you have with your health care provider. Document Released: 10/06/2015 Document Revised: 03/09/2018 Document Reviewed: 03/09/2018 Elsevier Interactive Patient Education  2019 [employer]  AMBULATORY SURGERY  DISCHARGE INSTRUCTIONS   The drugs that you were given will stay in your system until tomorrow so for the next 24 hours you should not:  Drive an automobile Make any legal decisions Drink any alcoholic beverage   You may resume regular meals tomorrow.  Today it is better to start with liquids and gradually work up to solid foods.  You may eat anything you prefer, but it is better to start with liquids, then soup and crackers, and gradually   work up to solid foods.   Please notify your doctor immediately if you have any unusual bleeding, trouble breathing, redness and pain at the surgery site, drainage, fever, or pain not relieved by medication.    Additional Instructions:        Please contact your physician with any problems or Same Day Surgery at [phone], Monday through Friday 6 am to 4 pm, or  at [facility] number at  [phone]. 

## 2023-03-14 NOTE — Op Note (Signed)
Pre-Op Dx: lipoma flank Post-Op Dx: same Anesthesia: GEA EBL: minimal Complications:  none apparent Specimen: lipoma, right flank Procedure: excisional biopsy of right flank lipoma Surgeon: Tonna Boehringer  Indications for procedure: See H&P  Description of Procedure:  Consent obtained, time out performed.  Patient placed in left lateral position.  Area sterilized and draped in usual position.  Local infused to area previously marked.  6cm incision made through dermis with 15blade and lipoma noted in subcutaneous layer.  The  3cm x 3cm x 1cm lipoma then removed from surrounding tissue completely using electrocautery, passed off field pending pathology.  Wound hemostasis noted, then closed in two layer fashion with 3-0 vicryl in interrupted fashion for deep dermal layer, then running 4-0 monocryl in subcuticular fashion for epidermal layer.  Wound then dressed with dermabond.  Pt tolerated procedure well, and transferred to PACU in stable condition. Sponge and instrument count correct at end of procedure.

## 2023-03-14 NOTE — Transfer of Care (Signed)
Immediate Anesthesia Transfer of Care Note  Patient: Julie Estrada  Procedure(s) Performed: EXCISION LIPOMA (Right: Hip)  Patient Location: PACU  Anesthesia Type:General  Level of Consciousness: awake  Airway & Oxygen Therapy: Patient Spontanous Breathing and Patient connected to face mask oxygen  Post-op Assessment: Report given to RN and Post -op Vital signs reviewed and stable  Post vital signs: Reviewed  Last Vitals:  Vitals Value Taken Time  BP 140/76   Temp    Pulse 100 03/14/23 1031  Resp 19 03/14/23 1031  SpO2 95 % 03/14/23 1031  Vitals shown include unvalidated device data.  Last Pain:  Vitals:   03/14/23 0827  TempSrc: Temporal  PainSc: 6          Complications: No notable events documented.

## 2023-03-14 NOTE — Anesthesia Procedure Notes (Signed)
Procedure Name: Intubation Date/Time: 03/14/2023 10:01 AM  Performed by: Mathews Argyle, CRNAPre-anesthesia Checklist: Patient identified, Patient being monitored, Timeout performed, Emergency Drugs available and Suction available Patient Re-evaluated:Patient Re-evaluated prior to induction Oxygen Delivery Method: Circle system utilized Preoxygenation: Pre-oxygenation with 100% oxygen Induction Type: IV induction Ventilation: Mask ventilation without difficulty Laryngoscope Size: Mac and 3 Grade View: Grade I Tube type: Oral Tube size: 6.5 mm Number of attempts: 1 Airway Equipment and Method: Stylet and Video-laryngoscopy Placement Confirmation: ETT inserted through vocal cords under direct vision, positive ETCO2 and breath sounds checked- equal and bilateral Secured at: 20 cm Tube secured with: Tape Dental Injury: Teeth and Oropharynx as per pre-operative assessment

## 2023-03-14 NOTE — Interval H&P Note (Signed)
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  The various methods of treatment have been discussed with the patient and family. After consideration of risks, benefits and other options for treatment, the patient has consented to  Procedure(s): EXCISION LIPOMA (Right) as a surgical intervention.  The patient's history has been reviewed, patient examined, no change in status, stable for surgery.  I have reviewed the patient's chart and labs.  Questions were answered to the patient's satisfaction.     Kenidi Elenbaas Tonna Boehringer
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
## 2023-12-17 ENCOUNTER — Encounter: Payer: Self-pay | Admitting: *Deleted

## 2023-12-17 ENCOUNTER — Other Ambulatory Visit: Payer: Self-pay

## 2023-12-17 DIAGNOSIS — J101 Influenza due to other identified influenza virus with other respiratory manifestations: Secondary | ICD-10-CM | POA: Insufficient documentation

## 2023-12-17 DIAGNOSIS — R059 Cough, unspecified: Secondary | ICD-10-CM | POA: Diagnosis present

## 2023-12-17 DIAGNOSIS — J45909 Unspecified asthma, uncomplicated: Secondary | ICD-10-CM | POA: Insufficient documentation

## 2023-12-17 LAB — RESP PANEL BY RT-PCR (RSV, FLU A&B, COVID)  RVPGX2
Influenza A by PCR: POSITIVE — AB
Influenza B by PCR: NEGATIVE
Resp Syncytial Virus by PCR: NEGATIVE
SARS Coronavirus 2 by RT PCR: NEGATIVE

## 2023-12-17 NOTE — ED Triage Notes (Signed)
 Pt ambulatory to triage.  Pt has a sore throat, cough, body aches.  Sx for 1 day.  Pt alert.

## 2023-12-18 ENCOUNTER — Emergency Department
Admission: EM | Admit: 2023-12-18 | Discharge: 2023-12-18 | Disposition: A | Discharge status: Home / Self Care | Attending: Emergency Medicine | Admitting: Emergency Medicine

## 2023-12-18 DIAGNOSIS — J101 Influenza due to other identified influenza virus with other respiratory manifestations: Secondary | ICD-10-CM

## 2023-12-18 LAB — GROUP A STREP BY PCR: Group A Strep by PCR: NOT DETECTED

## 2023-12-18 MED ORDER — IBUPROFEN 600 MG PO TABS
600.0000 mg | ORAL_TABLET | Freq: Once | ORAL | Status: AC
  Administered 2023-12-18: 600 mg via ORAL
  Filled 2023-12-18: qty 1

## 2023-12-18 MED ORDER — OSELTAMIVIR PHOSPHATE 75 MG PO CAPS
75.0000 mg | ORAL_CAPSULE | Freq: Once | ORAL | Status: DC
  Filled 2023-12-18: qty 1

## 2023-12-18 MED ORDER — OSELTAMIVIR PHOSPHATE 75 MG PO CAPS: 75.0000 mg | ORAL_CAPSULE | Freq: Two times a day (BID) | ORAL | 0 refills | Status: AC

## 2023-12-18 MED ORDER — PREDNISONE 20 MG PO TABS
60.0000 mg | ORAL_TABLET | Freq: Once | ORAL | Status: AC
  Administered 2023-12-18: 60 mg via ORAL
  Filled 2023-12-18: qty 3

## 2023-12-18 MED ORDER — ALBUTEROL SULFATE HFA 108 (90 BASE) MCG/ACT IN AERS: 2.0000 | INHALATION_SPRAY | Freq: Four times a day (QID) | RESPIRATORY_TRACT | 2 refills | Status: AC | PRN

## 2023-12-18 MED ORDER — PREDNISONE 50 MG PO TABS: ORAL_TABLET | ORAL | 0 refills | Status: DC

## 2023-12-18 MED ORDER — ACETAMINOPHEN 500 MG PO TABS
1000.0000 mg | ORAL_TABLET | Freq: Once | ORAL | Status: AC
  Administered 2023-12-18: 1000 mg via ORAL
  Filled 2023-12-18: qty 2

## 2023-12-18 NOTE — ED Notes (Signed)
 Written and verbal discharge instructions reviewed with pt, understanding verbalized, denied questions. Discharged from unit ambulatory in good condition with significant other.

## 2023-12-18 NOTE — ED Provider Notes (Signed)
 [facility] Provider Note    Event Date/Time   First MD Initiated Contact with Patient 12/18/23 0038     (approximate)   History   Cough and Sore Throat   HPI  Julie Estrada is a 33 y.o. female   Past medical history of IIH with shunt, migraines, PTSD, asthma who presents to the Emergency Department with 1 day of flulike symptoms.  Cough, congestion, myalgias and headache.  Has been having some shortness of breath relieved with her albuterol inhaler.  History of asthma.  No other acute medical complaints.  She did have her flu vaccination earlier this year  Independent Historian contributed to assessment above: Husband corroborates information past medical history as above    Physical Exam   Triage Vital Signs: ED Triage Vitals  Encounter Vitals Group     BP 12/17/23 2212 (!) 154/84     Systolic BP Percentile --      Diastolic BP Percentile --      Pulse Rate 12/17/23 2209 100     Resp 12/17/23 2209 20     Temp 12/17/23 2209 99.8 F (37.7 C)     Temp Source 12/17/23 2209 Oral     SpO2 12/17/23 2209 100 %     Weight 12/17/23 2211 252 lb (114.3 kg)     Height 12/17/23 2211 5\' 3"  (1.6 m)     Head Circumference --      Peak Flow --      Pain Score 12/17/23 2210 7     Pain Loc --      Pain Education --      Exclude from Growth Chart --     Most recent vital signs: Vitals:   12/17/23 2212 12/18/23 0047  BP: (!) 154/84 137/74  Pulse:  (!) 101  Resp:  18  Temp:    SpO2:  100%    General: Awake, no distress.  CV:  Good peripheral perfusion.  Resp:  Normal effort.  Abd:  No distention.  Other:  Well-appearing no acute distress mild tachycardia, afebrile.  Nontoxic-appearing.  Neck supple with full range of motion, just a scant wheeze to the apices and lung fields bilaterally with no focality.  Appears euvolemic overall.  Posterior oropharynx appears normal without lesions exudates or masses.   ED Results / Procedures / Treatments    Labs (all labs ordered are listed, but only abnormal results are displayed) Labs Reviewed  RESP PANEL BY RT-PCR (RSV, FLU A&B, COVID)  [id number] - Abnormal; Notable for the following components:      Result Value   Influenza A by PCR POSITIVE (*)    All other components within normal limits  GROUP A STREP BY PCR     I ordered and reviewed the above labs they are notable for influenza positive.  PROCEDURES:  Critical Care performed: No  Procedures   MEDICATIONS ORDERED IN ED: Medications  oseltamivir (TAMIFLU) capsule 75 mg (has no administration in time range)  predniSONE (DELTASONE) tablet 60 mg (60 mg Oral Given 12/18/23 0100)  acetaminophen (TYLENOL) tablet 1,000 mg (1,000 mg Oral Given 12/18/23 0100)  ibuprofen (ADVIL) tablet 600 mg (600 mg Oral Given 12/18/23 0100)    IMPRESSION / MDM / ASSESSMENT AND PLAN / ED COURSE  I reviewed the triage vital signs and the nursing notes.  Patient's presentation is most consistent with acute presentation with potential threat to life or bodily function.  Differential diagnosis includes, but is not limited to, influenza, viral URI, bacterial pneumonia, dehydration electrolyte derangement, considered but less likely sepsis  The patient is on the cardiac monitor to evaluate for evidence of arrhythmia and/or significant heart rate changes.  MDM: Patient with 1 day of flulike symptoms positive for influenza A.  She looks nontoxic only slightly tachycardic, I think she can be managed as outpatient.  Doubt sepsis.  No focality on lung exam to suggest bacterial pneumonia especially with 1 day of symptoms and positive flu test I think this is most likely cause.  She is a candidate for Tamiflu given inside of 48 hours and will prescribe medication for her.  Anticipatory guidance given.  Refill of albuterol.  Prednisone burst for mild asthma exacerbation.  Discharge.       FINAL CLINICAL IMPRESSION(S) / ED  DIAGNOSES   Final diagnoses:  Influenza A     Rx / DC Orders   ED Discharge Orders          Ordered    oseltamivir (TAMIFLU) 75 MG capsule  2 times daily        12/18/23 0050    albuterol (VENTOLIN HFA) 108 (90 Base) MCG/ACT inhaler  Every 6 hours PRN        12/18/23 0050    predniSONE (DELTASONE) 50 MG tablet        12/18/23 0050             Note:  This document was prepared using Dragon voice recognition software and may include unintentional dictation errors.    Pilar Jarvis, MD 12/18/23 [phone]

## 2023-12-18 NOTE — ED Notes (Signed)
 Pt states that she does not want to wait for tamiflu medication to be sent from main pharmacy. Doctor has prescribed tamiflu for her and that rx was sent in the patients preferred pharmacy.

## 2023-12-18 NOTE — Discharge Instructions (Signed)
 Take acetaminophen 650 mg and ibuprofen 400 mg every 6 hours for pain.  Take with food.  Use your inhaler as prescribed.  Use prednisone for the next 3 days as prescribed.  Thank you for choosing [facility] for your health care today!  Please see your primary doctor this week for a follow up appointment.   If you have any new, worsening, or unexpected symptoms call your doctor right away or come back to the emergency department for reevaluation.  It was my pleasure to care for you today.   Daneil Dan Modesto Charon, MD

## 2024-04-11 ENCOUNTER — Emergency Department
Admission: EM | Admit: 2024-04-11 | Discharge: 2024-04-11 | Disposition: A | Discharge status: Home / Self Care | Attending: Emergency Medicine | Admitting: Emergency Medicine

## 2024-04-11 ENCOUNTER — Other Ambulatory Visit: Payer: Self-pay

## 2024-04-11 DIAGNOSIS — Z23 Encounter for immunization: Secondary | ICD-10-CM | POA: Diagnosis not present

## 2024-04-11 DIAGNOSIS — S5011XA Contusion of right forearm, initial encounter: Secondary | ICD-10-CM | POA: Diagnosis not present

## 2024-04-11 DIAGNOSIS — I10 Essential (primary) hypertension: Secondary | ICD-10-CM | POA: Insufficient documentation

## 2024-04-11 DIAGNOSIS — S51832A Puncture wound without foreign body of left forearm, initial encounter: Secondary | ICD-10-CM | POA: Insufficient documentation

## 2024-04-11 DIAGNOSIS — W540XXA Bitten by dog, initial encounter: Secondary | ICD-10-CM | POA: Insufficient documentation

## 2024-04-11 DIAGNOSIS — E119 Type 2 diabetes mellitus without complications: Secondary | ICD-10-CM | POA: Insufficient documentation

## 2024-04-11 DIAGNOSIS — S59912A Unspecified injury of left forearm, initial encounter: Secondary | ICD-10-CM | POA: Diagnosis present

## 2024-04-11 MED ORDER — CLINDAMYCIN HCL 150 MG PO CAPS
450.0000 mg | ORAL_CAPSULE | Freq: Once | ORAL | Status: AC
  Administered 2024-04-11: 450 mg via ORAL
  Filled 2024-04-11: qty 3

## 2024-04-11 MED ORDER — SULFAMETHOXAZOLE-TRIMETHOPRIM 800-160 MG PO TABS
1.0000 | ORAL_TABLET | Freq: Once | ORAL | Status: AC
  Administered 2024-04-11: 1 via ORAL
  Filled 2024-04-11: qty 1

## 2024-04-11 MED ORDER — TETANUS-DIPHTH-ACELL PERTUSSIS 5-2.5-18.5 LF-MCG/0.5 IM SUSY
0.5000 mL | PREFILLED_SYRINGE | Freq: Once | INTRAMUSCULAR | Status: AC
  Administered 2024-04-11: 0.5 mL via INTRAMUSCULAR
  Filled 2024-04-11: qty 0.5

## 2024-04-11 MED ORDER — LIDOCAINE-EPINEPHRINE-TETRACAINE (LET) TOPICAL GEL
3.0000 mL | Freq: Once | TOPICAL | Status: AC
  Administered 2024-04-11: 3 mL via TOPICAL
  Filled 2024-04-11: qty 3

## 2024-04-11 MED ORDER — CLINDAMYCIN HCL 150 MG PO CAPS: 450.0000 mg | ORAL_CAPSULE | Freq: Three times a day (TID) | ORAL | 0 refills | Status: AC

## 2024-04-11 MED ORDER — SULFAMETHOXAZOLE-TRIMETHOPRIM 800-160 MG PO TABS: 1.0000 | ORAL_TABLET | Freq: Two times a day (BID) | ORAL | 0 refills | Status: AC
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
## 2024-04-11 NOTE — ED Provider Notes (Signed)
 [facility] Provider Note    Event Date/Time   First MD Initiated Contact with Patient 04/11/24 2021     (approximate)   History   Animal Bite   HPI  Julie Estrada is a 33 y.o. female  with a past medical history of GAD, Type 2 Diabetes, HTN, GERD presents to the emergency department following a dog bite this morning.  Patient has a laceration noted to the left anterior forearm and bruising on the bilateral forearms.  Patient states her 2 dogs were fighting and she tried to pull them apart when one of them bit her.  Concern regarding puncture wound as it is still bleeding at this time.  No puslike discharge or fever, no numbness or tingling.  She has been in contact with the police department and telemed, they suggested coming to the emergency department for possible suturing.  She has also been in contact with animal control and her dog is being quarantined for rabies.  Both dogs were not up-to-date on rabies vaccinations at the time of the bite.  Patient has allergies to cephalosporins, penicillins.  Currently on aspirin and ticagrelor for upcoming surgical procedure this Thursday.  Last tetanus injection was in 2017.     Physical Exam   Triage Vital Signs: ED Triage Vitals  Encounter Vitals Group     BP 04/11/24 2001 (!) 144/90     Girls Systolic BP Percentile --      Girls Diastolic BP Percentile --      Boys Systolic BP Percentile --      Boys Diastolic BP Percentile --      Pulse Rate 04/11/24 2001 83     Resp 04/11/24 2001 18     Temp 04/11/24 2001 99 F (37.2 C)     Temp Source 04/11/24 2001 Oral     SpO2 04/11/24 2001 98 %     Weight --      Height --      Head Circumference --      Peak Flow --      Pain Score 04/11/24 2002 5     Pain Loc --      Pain Education --      Exclude from Growth Chart --     Most recent vital signs: Vitals:   04/11/24 2001 04/11/24 2158  BP: (!) 144/90 120/74  Pulse: 83 60  Resp: 18 18  Temp: 99 F  (37.2 C)   SpO2: 98% 98%    General: Awake, in no acute distress. Appears stated age. Head: Normocephalic, atraumatic. CV: Regular rate, 60 bpm. Peripheral pulses 2+ and symmetric. No edema. Respiratory: No respiratory distress. Normal respiratory effort. GI: Soft, non-distended. MSK: Grossly normal ROM and  strength in bilateral upper extremities.  Skin:Warm, dry.  1 cm puncture wound noted on the anterior aspect of the distal left forearm with ecchymosis.  Ecchymosis also noted on anterior aspect of distal right forearm. Neurological: A&Ox4 to person, place, time, and situation.   ED Results / Procedures / Treatments   Labs (all labs ordered are listed, but only abnormal results are displayed) Labs Reviewed - No data to display   EKG     RADIOLOGY    PROCEDURES:  Critical Care performed: No   Procedures   MEDICATIONS ORDERED IN ED: Medications  Tdap (BOOSTRIX) injection 0.5 mL (0.5 mLs Intramuscular Given 04/11/24 2106)  clindamycin  (CLEOCIN ) capsule 450 mg (450 mg Oral Given 04/11/24 2105)  sulfamethoxazole -trimethoprim  (BACTRIM  DS)  800-160 MG per tablet 1 tablet (1 tablet Oral Given 04/11/24 2105)  lidocaine -EPINEPHrine -tetracaine  (LET) topical gel (3 mLs Topical Given 04/11/24 2154)     IMPRESSION / MDM / ASSESSMENT AND PLAN / ED COURSE  I reviewed the triage vital signs and the nursing notes.                              Differential diagnosis includes, but is not limited to, animal bite, cellulitis, laceration, abrasion, contusion, hematoma  Patient's presentation is most consistent with acute presentation with potential threat to life or bodily function.  Patient is a 32 year old female presenting today following an animal bite.  Patient was instructed by TeleMed to come to the ER for any potential sutures of her 1 cm laceration, but I discussed with her how we leave these open to heal by secondary intent.  Patient was provided with updated tetanus vaccine.   She also concerns regarding the bleeding, so I provided her with some LET topical gel prior to discharge.  Given 1 dose of clindamycin  and Bactrim  here in the emergency department as she is allergic to penicillins and cephalosporins.  Provided the remainder of these antibiotics as a prescription outpatient.  Wound care instructions were discussed with her.  She can return to the emergency department for any new or worsening symptoms.  We did discuss following up with animal control after the quarantine to determine if there is a need for rabies vaccinations.  Patient was given the opportunity to ask questions; all questions were answered. Emergency department return precautions were discussed with the patient.  Patient is in agreement to the treatment plan.  Patient is stable for discharge.    FINAL CLINICAL IMPRESSION(S) / ED DIAGNOSES   Final diagnoses:  Dog bite, initial encounter     Rx / DC Orders   ED Discharge Orders          Ordered    clindamycin  (CLEOCIN ) 150 MG capsule  3 times daily        04/11/24 2143    sulfamethoxazole -trimethoprim  (BACTRIM  DS) 800-160 MG tablet  2 times daily        04/11/24 2143             Note:  This document was prepared using Dragon voice recognition software and may include unintentional dictation errors.     Sheron Salm, PA-C 04/11/24 2308    Willo Dunnings, MD 04/13/24 [phone]

## 2024-04-11 NOTE — Discharge Instructions (Addendum)
 You were seen in the emergency department for a dog bite.  Please pick up and take the antibiotics as prescribed.  Please take these antibiotics with food and a probiotic of your choice; any probiotic over-the-counter at any drugstore is fine.  Please follow-up with animal control regarding observation for rabies development from the animal that bit you.  If the animal does test positive for rabies, you will need to return to the emergency department or the [facility] health urgent care in [place] or [place] to start series of rabies vaccines.

## 2024-04-30 DIAGNOSIS — G935 Compression of brain: Secondary | ICD-10-CM | POA: Insufficient documentation

## 2024-05-05 ENCOUNTER — Other Ambulatory Visit: Payer: Self-pay

## 2024-05-05 ENCOUNTER — Emergency Department
Admission: EM | Admit: 2024-05-05 | Discharge: 2024-05-05 | Disposition: A | Discharge status: Home / Self Care | Attending: Emergency Medicine | Admitting: Emergency Medicine

## 2024-05-05 DIAGNOSIS — R110 Nausea: Secondary | ICD-10-CM | POA: Diagnosis present

## 2024-05-05 DIAGNOSIS — R5383 Other fatigue: Secondary | ICD-10-CM | POA: Diagnosis not present

## 2024-05-05 DIAGNOSIS — R519 Headache, unspecified: Secondary | ICD-10-CM | POA: Insufficient documentation

## 2024-05-05 LAB — COMPREHENSIVE METABOLIC PANEL WITH GFR
ALT: 24 U/L (ref 0–44)
AST: 24 U/L (ref 15–41)
Albumin: 4 g/dL (ref 3.5–5.0)
Alkaline Phosphatase: 27 U/L — ABNORMAL LOW (ref 38–126)
Anion gap: 9 (ref 5–15)
BUN: 22 mg/dL — ABNORMAL HIGH (ref 6–20)
CO2: 22 mmol/L (ref 22–32)
Calcium: 9.3 mg/dL (ref 8.9–10.3)
Chloride: 106 mmol/L (ref 98–111)
Creatinine, Ser: 0.75 mg/dL (ref 0.44–1.00)
GFR, Estimated: >60 mL/min (ref >60)
Glucose, Bld: 108 mg/dL — ABNORMAL HIGH (ref 70–99)
Potassium: 3.8 mmol/L (ref 3.5–5.1)
Sodium: 137 mmol/L (ref 135–145)
Total Bilirubin: 0.6 mg/dL (ref 0.0–1.2)
Total Protein: 7.3 g/dL (ref 6.5–8.1)

## 2024-05-05 LAB — URINALYSIS, ROUTINE W REFLEX MICROSCOPIC
Bilirubin Urine: NEGATIVE
Glucose, UA: NEGATIVE mg/dL
Hgb urine dipstick: NEGATIVE
Ketones, ur: NEGATIVE mg/dL
Leukocytes,Ua: NEGATIVE
Nitrite: NEGATIVE
Protein, ur: NEGATIVE mg/dL
Specific Gravity, Urine: 1.013 (ref 1.005–1.030)
pH: 7 (ref 5.0–8.0)

## 2024-05-05 LAB — LIPASE, BLOOD: Lipase: 38 U/L (ref 11–51)

## 2024-05-05 LAB — CBC
HCT: 40.2 % (ref 36.0–46.0)
Hemoglobin: 13 g/dL (ref 12.0–15.0)
MCH: 28.2 pg (ref 26.0–34.0)
MCHC: 32.3 g/dL (ref 30.0–36.0)
MCV: 87.2 fL (ref 80.0–100.0)
Platelets: 270 K/uL (ref 150–400)
RBC: 4.61 MIL/uL (ref 3.87–5.11)
RDW: 12.6 % (ref 11.5–15.5)
WBC: 5.3 K/uL (ref 4.0–10.5)
nRBC: 0 % (ref 0.0–0.2)

## 2024-05-05 MED ORDER — PROMETHAZINE HCL 12.5 MG PO TABS: 12.5000 mg | ORAL_TABLET | Freq: Four times a day (QID) | ORAL | 0 refills | Status: AC | PRN

## 2024-05-05 NOTE — ED Triage Notes (Signed)
 Seen by PCP on Friday and had bloodwork done.  Has not been feeling well for the past month.  Na 126, potassium and calcium  low.  Also has history of intercranial HTN.  This morning woke up with nausea and vomiting and feels flushed.  AAOx3.  Skin warm and dry. NAD

## 2024-05-05 NOTE — Discharge Instructions (Signed)
 You were seen in the emergency department today for your nausea.  Your labs here showed that your sodium and potassium level are fortunately normal.  Please contact your primary care doctor to arrange follow-up as they recommend.  Please continue to follow-up with your neurosurgeon regarding your headaches.  Return to the ER for new or worsening symptoms.  You can continue to take your Zofran  as needed for nausea, but I have sent a different medicine called Phenergan  to your pharmacy.  You can see if this is more effective for your symptoms.  This can make you drowsy, do not drive or operate machinery when taking this.

## 2024-05-05 NOTE — ED Provider Notes (Signed)
 [facility] Provider Note    Event Date/Time   First MD Initiated Contact with Patient 05/05/24 [phone]     (approximate)   History   No chief complaint on file.   HPI  Julie Estrada is a 33 year old female with history of IIH presenting to the emergency department for evaluation of nausea.  Reports over the past few months she has had ongoing headache and nausea.  Saw her primary care doctor last Friday and had labs performed which were notable for hyponatremia with sodium of 126, hypokalemia with K of 3.5.  She was instructed to increase her electrolyte intake and decrease her free water intake with plans for repeat labs later this week.  However, today, patient had some increased nausea and was concerned that her electrolytes may have worsened leading her to present to the ER.  No fevers, abdominal pain.  Ongoing headache that she has been following up with her neurosurgeon for, not acutely changed.  No double vision.     Physical Exam   Triage Vital Signs: ED Triage Vitals  Encounter Vitals Group     BP 05/05/24 0707 134/89     Girls Systolic BP Percentile --      Girls Diastolic BP Percentile --      Boys Systolic BP Percentile --      Boys Diastolic BP Percentile --      Pulse Rate 05/05/24 0707 77     Resp 05/05/24 0707 18     Temp 05/05/24 0707 97.9 F (36.6 C)     Temp src --      SpO2 05/05/24 0707 99 %     Weight 05/05/24 0705 259 lb 14.8 oz (117.9 kg)     Height --      Head Circumference --      Peak Flow --      Pain Score 05/05/24 0705 0     Pain Loc --      Pain Education --      Exclude from Growth Chart --     Most recent vital signs: Vitals:   05/05/24 0707  BP: 134/89  Pulse: 77  Resp: 18  Temp: 97.9 F (36.6 C)  SpO2: 99%     General: Awake, interactive  CV:  Regular rate, good peripheral perfusion.  Resp:  Unlabored respirations.  Abd:  Nondistended, soft, nontender Neuro:  Symmetric facial movement, fluid  speech, 5-5 strength of the bilateral upper and lower extremities with normal sensation,    ED Results / Procedures / Treatments   Labs (all labs ordered are listed, but only abnormal results are displayed) Labs Reviewed  COMPREHENSIVE METABOLIC PANEL WITH GFR - Abnormal; Notable for the following components:      Result Value   Glucose, Bld 108 (*)    BUN 22 (*)    Alkaline Phosphatase 27 (*)    All other components within normal limits  URINALYSIS, ROUTINE W REFLEX MICROSCOPIC - Abnormal; Notable for the following components:   Color, Urine STRAW (*)    APPearance CLEAR (*)    All other components within normal limits  LIPASE, BLOOD  CBC     EKG EKG independently reviewed and interpreted by myself demonstrates:    RADIOLOGY Imaging independently reviewed and interpreted by myself demonstrates:   Formal Radiology Read:  No results found.  PROCEDURES:  Critical Care performed: No  Procedures   MEDICATIONS ORDERED IN ED: Medications - No data to display  IMPRESSION / MDM / ASSESSMENT AND PLAN / ED COURSE  I reviewed the triage vital signs and the nursing notes.  Differential diagnosis includes, but is not limited to, worsening electrolyte abnormality, lower suspicion acute intra-abdominal process with reassuring abdominal exam, lower suspicion acute intracranial process with nonfocal neurologic exam, no change in recent headaches  Patient's presentation is most consistent with acute presentation with potential threat to life or bodily function.  33 year old female presenting with increased fatigue in the setting of recent abnormal outpatient labs.  Labs repeated here with normalization of both her potassium and sodium levels.  She is without focal neurologic deficits here.  I did discuss further management of her nausea in the emergency department, but patient reports she is reassured by her improved lab values and would prefer to be discharged home.  She has had  limited improvement with Zofran , so will DC with prescription for Phenergan .  Strict return precautions provided.  Patient discharged stable condition.      FINAL CLINICAL IMPRESSION(S) / ED DIAGNOSES   Final diagnoses:  Nausea     Rx / DC Orders   ED Discharge Orders          Ordered    promethazine  (PHENERGAN ) 12.5 MG tablet  Every 6 hours PRN        05/05/24 0804             Note:  This document was prepared using Dragon voice recognition software and may include unintentional dictation errors.   Levander Slate, MD 05/05/24 [phone]

## 2024-05-05 NOTE — ED Notes (Signed)
 Patient refuses covid swab in triage

## 2024-08-23 ENCOUNTER — Ambulatory Visit
Admission: EM | Admit: 2024-08-23 | Discharge: 2024-08-23 | Disposition: A | Discharge status: Home / Self Care | Attending: Emergency Medicine | Admitting: Emergency Medicine

## 2024-08-23 ENCOUNTER — Encounter: Payer: Self-pay | Admitting: Emergency Medicine

## 2024-08-23 DIAGNOSIS — Z20818 Contact with and (suspected) exposure to other bacterial communicable diseases: Secondary | ICD-10-CM | POA: Insufficient documentation

## 2024-08-23 DIAGNOSIS — J029 Acute pharyngitis, unspecified: Secondary | ICD-10-CM | POA: Diagnosis not present

## 2024-08-23 DIAGNOSIS — Z20822 Contact with and (suspected) exposure to covid-19: Secondary | ICD-10-CM | POA: Insufficient documentation

## 2024-08-23 LAB — POC COVID19/FLU A&B COMBO
Covid Antigen, POC: NEGATIVE
Influenza A Antigen, POC: NEGATIVE
Influenza B Antigen, POC: NEGATIVE

## 2024-08-23 LAB — POCT RAPID STREP A (OFFICE): Rapid Strep A Screen: NEGATIVE

## 2024-08-23 MED ORDER — IBUPROFEN 600 MG PO TABS: 600.0000 mg | ORAL_TABLET | Freq: Four times a day (QID) | ORAL | 0 refills | Status: AC | PRN

## 2024-08-23 MED ORDER — IPRATROPIUM BROMIDE 0.06 % NA SOLN: 2.0000 | Freq: Four times a day (QID) | NASAL | 0 refills | Status: AC

## 2024-08-23 NOTE — Discharge Instructions (Signed)
 Your rapid strep was negative today.  We have sent it off for culture to confirm absence of strep throat.  If it is positive, we will contact you and call in the appropriate antibiotics.  1 gram of Tylenol  and 600 mg ibuprofen  together 3-4 times a day as needed for pain.  Make sure you drink plenty of extra fluids.  Some people find salt water gargles and  Traditional Medicinal's Throat Coat tea helpful. Take 5 mL of liquid Benadryl  and 5 mL of Maalox/Mylanta. Mix it together, and then hold it in your mouth for as long as you can and then swallow. You may do this 4 times a day.  Honey and lemon dissolved in hot water can also be soothing.  You may also want to check a COVID and flu test tomorrow as well.  In the meantime, try Mucinex, saline nasal irrigation with a NeilMed sinus rinse and distilled water as often as you want and Atrovent nasal spray for the nasal congestion and postnasal drip.  Go to www.goodrx.com  or www.costplusdrugs.com to look up your medications. This will give you a list of where you can find your prescriptions at the most affordable prices. Or ask the pharmacist what the cash price is, or if they have any other discount programs available to help make your medication more affordable. This can be less expensive than what you would pay with insurance.

## 2024-08-23 NOTE — ED Triage Notes (Signed)
 Pt c/o sore throat & runny nose that started today. Denies fevers. No OTC meds.
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
## 2024-08-25 ENCOUNTER — Ambulatory Visit (HOSPITAL_COMMUNITY): Payer: Self-pay

## 2024-08-25 LAB — CULTURE, GROUP A STREP (THRC)
